# Patient Record
Sex: Female | Born: 1960 | Race: White | Hispanic: No | Marital: Single | Smoking: Current every day smoker
Health system: Southern US, Community
[De-identification: ages and names within clinical notes are randomized; demographics above are authoritative.]

## PROBLEM LIST (undated history)

## (undated) DIAGNOSIS — Z720 Tobacco use: Secondary | ICD-10-CM

## (undated) DIAGNOSIS — R918 Other nonspecific abnormal finding of lung field: Secondary | ICD-10-CM

## (undated) DIAGNOSIS — C801 Malignant (primary) neoplasm, unspecified: Secondary | ICD-10-CM

## (undated) DIAGNOSIS — M549 Dorsalgia, unspecified: Secondary | ICD-10-CM

## (undated) DIAGNOSIS — G43909 Migraine, unspecified, not intractable, without status migrainosus: Secondary | ICD-10-CM

## (undated) DIAGNOSIS — F419 Anxiety disorder, unspecified: Secondary | ICD-10-CM

## (undated) DIAGNOSIS — G971 Other reaction to spinal and lumbar puncture: Secondary | ICD-10-CM

## (undated) DIAGNOSIS — G8929 Other chronic pain: Secondary | ICD-10-CM

## (undated) DIAGNOSIS — Z8709 Personal history of other diseases of the respiratory system: Secondary | ICD-10-CM

## (undated) DIAGNOSIS — J449 Chronic obstructive pulmonary disease, unspecified: Secondary | ICD-10-CM

## (undated) DIAGNOSIS — R87629 Unspecified abnormal cytological findings in specimens from vagina: Secondary | ICD-10-CM

## (undated) HISTORY — PX: TONSILLECTOMY: SUR1361

## (undated) HISTORY — DX: Other nonspecific abnormal finding of lung field: R91.8

## (undated) HISTORY — DX: Unspecified abnormal cytological findings in specimens from vagina: R87.629

## (undated) HISTORY — DX: Personal history of other diseases of the respiratory system: Z87.09

## (undated) HISTORY — PX: CRYOTHERAPY: SHX1416

## (undated) HISTORY — DX: Migraine, unspecified, not intractable, without status migrainosus: G43.909

## (undated) HISTORY — DX: Chronic obstructive pulmonary disease, unspecified: J44.9

## (undated) HISTORY — DX: Tobacco use: Z72.0

## (undated) HISTORY — PX: BREAST SURGERY: SHX581

## (undated) HISTORY — DX: Anxiety disorder, unspecified: F41.9

---

## 1998-12-23 HISTORY — PX: TUBAL LIGATION: SHX77

## 2001-03-29 ENCOUNTER — Emergency Department (HOSPITAL_COMMUNITY): Admission: EM | Admit: 2001-03-29 | Discharge: 2001-03-29 | Payer: Self-pay | Admitting: *Deleted

## 2004-05-18 ENCOUNTER — Encounter
Admission: RE | Admit: 2004-05-18 | Discharge: 2004-08-16 | Payer: Self-pay | Admitting: Physical Medicine and Rehabilitation

## 2008-12-09 ENCOUNTER — Ambulatory Visit: Payer: Self-pay | Admitting: Cardiology

## 2009-06-16 ENCOUNTER — Ambulatory Visit: Payer: Self-pay | Admitting: Cardiology

## 2012-07-11 DIAGNOSIS — R079 Chest pain, unspecified: Secondary | ICD-10-CM

## 2012-09-28 DIAGNOSIS — R079 Chest pain, unspecified: Secondary | ICD-10-CM

## 2012-10-18 ENCOUNTER — Encounter: Payer: Self-pay | Admitting: *Deleted

## 2012-10-19 ENCOUNTER — Encounter: Payer: Self-pay | Admitting: Cardiology

## 2012-10-19 ENCOUNTER — Ambulatory Visit (INDEPENDENT_AMBULATORY_CARE_PROVIDER_SITE_OTHER): Payer: Medicaid Other | Admitting: Cardiology

## 2012-10-19 VITALS — BP 114/79 | HR 103 | Ht <= 58 in | Wt 81.0 lb

## 2012-10-19 DIAGNOSIS — R002 Palpitations: Secondary | ICD-10-CM

## 2012-10-19 DIAGNOSIS — Z136 Encounter for screening for cardiovascular disorders: Secondary | ICD-10-CM

## 2012-10-19 NOTE — Patient Instructions (Signed)
   2 week heart monitor - will be mailed to your home  Office will contact with results Continue all current medications. Follow up after above

## 2012-10-19 NOTE — Progress Notes (Signed)
Patient ID: Amber Salazar, female   DOB: [birth year], 51 y.o.   MRN: [id number] PCP: Dr. Dimas Aguas  51 yo with history of COPD presents for evaluation of abnormal ECG and palpitations.  Patient was noted to have a short PR interval on ECG by her PCP and was referred for evaluation.  She has been under a lot of stress lately (one of her sons is going to be going to prison).  Since 9/13, she has had episodes of tachypalpitations.  She will feel her heart race for a few minutes.  It seems to be stress-related.  No lightheadedness or syncope with these spells.  She does not remember palpitations prior to 9/13.  She did have one episode where she was at work and actually thought she felt her heart slow down a lot.  She became lightheaded but did not pass out.  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  Despite her COPD, she is able to perform her duties with minimal exertional dyspnea though she is tired at the end of the day.  No chest pain.  No claudication.   ECG: NSR with short PR interval (95 msec), nonspecific ST changes.  No delta wave.   PMH: 1. Migraine headaches 2. COPD: Diagnosed by PFTs, active smoker.  Sees Dr. Orson Aloe for pulmonary.   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   FH: No cardiac disease.   ROS: All systems reviewed and negative except as per HPI.   Current Outpatient Prescriptions  Medication Sig Dispense Refill  . acetaminophen (TYLENOL) 500 MG tablet Take 1,000 mg by mouth every 6 (six) hours as needed.      Marland Kitchen albuterol (PROVENTIL HFA;VENTOLIN HFA) 108 (90 BASE) MCG/ACT inhaler Inhale 2 puffs into the lungs every 6 (six) hours as needed.      Marland Kitchen ibuprofen (ADVIL,MOTRIN) 400 MG tablet Take 400 mg by mouth every 6 (six) hours as needed.        BP 114/79  Pulse 103  Ht 4\' 10"  (1.473 m)  Wt 81 lb (36.741 kg)  BMI 16.93 kg/m2 General: NAD Neck: No JVD, no thyromegaly or thyroid nodule.  Lungs: Prolonged expiratory  phase.  CV: Nondisplaced PMI.  Heart regular S1/S2, no S3/S4, no murmur.  No peripheral edema.  No carotid bruit.  Normal pedal pulses.  Abdomen: Soft, nontender, no hepatosplenomegaly, no distention.  Skin: Intact without lesions or rashes.  Neurologic: Alert and oriented x 3.  Psych: Normal affect. Extremities: No clubbing or cyanosis.  HEENT: Normal.   Assessment/Plan: 1. Palpitations/abnormal ECG: Patient has a short PR interval on ECG.  There is no delta wave so WPW is unlikely.  Lown-Ganong-Levine syndrome and enhanced AV nodal conduction are possibilities to explain this ECG finding.  Both of these entities can lead to episodes of SVT.  Her tachypalpitations could be SVT.  I will have her wear a 2 week event monitor to look for SVT.  If this is documented, I would likely start her on a calcium channel blocker (rather than beta blocker given COPD).  2. Smoking/COPD: I strongly encouraged her to try to quit smoking.  Wellbutrin would be a reasonable cessation aide for her.  She should be careful with albuterol, especially if SVT is documented.  Atrovent would be a less arrhythmogenic alternative bronchodilator.   Marca Ancona 10/19/2012 1:36 PM

## 2012-10-24 DIAGNOSIS — R0989 Other specified symptoms and signs involving the circulatory and respiratory systems: Secondary | ICD-10-CM

## 2012-11-06 ENCOUNTER — Telehealth: Payer: Self-pay | Admitting: Cardiology

## 2012-11-06 NOTE — Telephone Encounter (Signed)
Pt called back and I spoke w/ Inocencio Homes and she told me to tell the patient to go ahead and send the monitor back to [facility].

## 2012-11-06 NOTE — Telephone Encounter (Signed)
[facility] called and said the readings from start till 12-28 was good (11 days) and one of the prongs was messed up on the inside causing the machine to not work. She was suppose to return it today and was wondering will she have to wear it longer because it messed up or can she go ahead and return it.

## 2012-11-06 NOTE — Telephone Encounter (Signed)
Noted, patient has follow up already scheduled for 11/22/12 with Dr. Shirlee Latch.

## 2012-11-09 ENCOUNTER — Ambulatory Visit: Payer: Medicaid Other | Admitting: Cardiology

## 2012-11-22 ENCOUNTER — Encounter: Payer: Self-pay | Admitting: Cardiology

## 2012-11-22 ENCOUNTER — Ambulatory Visit (INDEPENDENT_AMBULATORY_CARE_PROVIDER_SITE_OTHER): Payer: Medicaid Other | Admitting: Cardiology

## 2012-11-22 VITALS — BP 112/78 | HR 88 | Ht <= 58 in | Wt 86.1 lb

## 2012-11-22 DIAGNOSIS — R002 Palpitations: Secondary | ICD-10-CM

## 2012-11-22 MED ORDER — AZITHROMYCIN 250 MG PO TABS: ORAL_TABLET | ORAL | Status: DC

## 2012-11-22 MED ORDER — IPRATROPIUM BROMIDE HFA 17 MCG/ACT IN AERS: 1.0000 | INHALATION_SPRAY | Freq: Four times a day (QID) | RESPIRATORY_TRACT | Status: DC | PRN

## 2012-11-22 NOTE — Progress Notes (Signed)
Patient ID: Amber Salazar, female   DOB: 07-25-1961, 52 y.o.   MRN: [id number] PCP: Dr. Dimas Aguas  52 yo with history of COPD presented initially for evaluation of abnormal ECG and palpitations.  Patient was noted to have a short PR interval on ECG by her PCP and was referred for evaluation. Starting in 9/13, she has had episodes of tachypalpitations.  She will feel her heart race for a few minutes.  It seems to be stress-related.  No lightheadedness or syncope with these spells.  She does not remember palpitations prior to 9/13.  She has been under stress.  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  Despite her COPD, she is able to perform her duties with minimal exertional dyspnea though she is tired at the end of the day.  No chest pain.  No claudication.   To work up the palpitations, I had her wear an event monitor for 3 weeks.  This showed only occasional PACs, no significant arrhythmias.  Since then, the palpitations seem to have died down.  She has been avoiding caffeine and has not been using albuterol often.  For the last several days, she has been wheezing and coughing.   ECG: NSR with short PR interval (95 msec), nonspecific ST changes.  No delta wave.   PMH: 1. Migraine headaches 2. COPD: Diagnosed by PFTs, active smoker.  Sees Dr. Orson Aloe for pulmonary.  3. Palpitations: Suspect PACs.  3 week event monitor (12/13) with PACs only.   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   FH: No cardiac disease.    Current Outpatient Prescriptions  Medication Sig Dispense Refill  . acetaminophen (TYLENOL) 500 MG tablet Take 1,000 mg by mouth every 6 (six) hours as needed.      Marland Kitchen ibuprofen (ADVIL,MOTRIN) 400 MG tablet Take 400 mg by mouth every 6 (six) hours as needed.      Marland Kitchen azithromycin (ZITHROMAX Z-PAK) 250 MG tablet Take as directed  6 each  0  . ipratropium (ATROVENT HFA) 17 MCG/ACT inhaler Inhale 1-2 puffs into the lungs 4 (four) times  daily as needed for wheezing.  1 Inhaler  1    BP 112/78  Pulse 88  Ht 4' 9.5" (1.461 m)  Wt 86 lb 1.9 oz (39.064 kg)  BMI 18.31 kg/m2 General: NAD Neck: No JVD, no thyromegaly or thyroid nodule.  Lungs: Prolonged expiratory phase.  CV: Nondisplaced PMI.  Heart regular S1/S2, no S3/S4, no murmur.  No peripheral edema.  No carotid bruit.  Normal pedal pulses.  Abdomen: Soft, nontender, no hepatosplenomegaly, no distention.  Neurologic: Alert and oriented x 3.  Psych: Normal affect. Extremities: No clubbing or cyanosis.   Assessment/Plan: 1. Palpitations/abnormal ECG: Patient has a short PR interval on ECG.  There is no delta wave so WPW is unlikely.  Lown-Ganong-Levine syndrome and enhanced AV nodal conduction are possibilities to explain this ECG finding.  Both of these entities can lead to episodes of SVT.  Her event monitor, however, only showed PACs.  This may be the cause of her symptoms  They also seem to have subsided in terms of symptoms.  I will give her a prescription for Atrovent to use instead of albuterol, which may trigger PACs.  2. Smoking/COPD: I strongly encouraged her to try to quit smoking.  Wellbutrin would be a reasonable cessation aide for her.  I gave her a prescription for Atrovent to use in lieu of albuterol  given PACs.  She has been coughing and wheezing for the last few days.  Given her COPD, I will give her a prescription for a course of azithromycin today.   Marca Ancona 11/22/2012 10:27 PM

## 2012-11-22 NOTE — Patient Instructions (Signed)
   Stop Albuterol  Change to Atrovent 1-2 puffs four x day as needed   Z-pack  Continue all other current medications. Follow up as needed

## 2013-07-04 ENCOUNTER — Ambulatory Visit (INDEPENDENT_AMBULATORY_CARE_PROVIDER_SITE_OTHER): Payer: Medicaid Other

## 2013-07-04 ENCOUNTER — Ambulatory Visit (INDEPENDENT_AMBULATORY_CARE_PROVIDER_SITE_OTHER): Payer: Medicaid Other | Admitting: Neurology

## 2013-07-04 DIAGNOSIS — Z0289 Encounter for other administrative examinations: Secondary | ICD-10-CM

## 2013-07-04 DIAGNOSIS — M79609 Pain in unspecified limb: Secondary | ICD-10-CM

## 2013-07-04 DIAGNOSIS — M47812 Spondylosis without myelopathy or radiculopathy, cervical region: Secondary | ICD-10-CM

## 2013-07-04 DIAGNOSIS — M47817 Spondylosis without myelopathy or radiculopathy, lumbosacral region: Secondary | ICD-10-CM
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
## 2013-07-30 DIAGNOSIS — Z0289 Encounter for other administrative examinations: Secondary | ICD-10-CM

## 2014-07-03 ENCOUNTER — Other Ambulatory Visit: Payer: Self-pay | Admitting: Unknown Physician Specialty

## 2014-07-03 DIAGNOSIS — R921 Mammographic calcification found on diagnostic imaging of breast: Secondary | ICD-10-CM

## 2014-07-10 ENCOUNTER — Ambulatory Visit
Admission: RE | Admit: 2014-07-10 | Discharge: 2014-07-10 | Disposition: A | Discharge status: Home / Self Care | Payer: Medicaid Other | Source: Ambulatory Visit | Attending: Unknown Physician Specialty | Admitting: Unknown Physician Specialty

## 2014-07-10 DIAGNOSIS — R921 Mammographic calcification found on diagnostic imaging of breast: Secondary | ICD-10-CM

## 2016-02-03 DIAGNOSIS — J449 Chronic obstructive pulmonary disease, unspecified: Secondary | ICD-10-CM | POA: Diagnosis not present

## 2016-02-03 DIAGNOSIS — D649 Anemia, unspecified: Secondary | ICD-10-CM | POA: Diagnosis not present

## 2016-02-03 DIAGNOSIS — F329 Major depressive disorder, single episode, unspecified: Secondary | ICD-10-CM | POA: Diagnosis not present

## 2016-02-03 DIAGNOSIS — M545 Low back pain: Secondary | ICD-10-CM | POA: Diagnosis not present

## 2016-02-03 DIAGNOSIS — Z1389 Encounter for screening for other disorder: Secondary | ICD-10-CM | POA: Diagnosis not present

## 2016-02-03 DIAGNOSIS — M542 Cervicalgia: Secondary | ICD-10-CM | POA: Diagnosis not present

## 2016-02-03 DIAGNOSIS — Z720 Tobacco use: Secondary | ICD-10-CM | POA: Diagnosis not present

## 2016-05-03 DIAGNOSIS — Z720 Tobacco use: Secondary | ICD-10-CM | POA: Diagnosis not present

## 2016-05-03 DIAGNOSIS — J019 Acute sinusitis, unspecified: Secondary | ICD-10-CM | POA: Diagnosis not present

## 2016-05-03 DIAGNOSIS — R5383 Other fatigue: Secondary | ICD-10-CM | POA: Diagnosis not present

## 2016-05-03 DIAGNOSIS — J449 Chronic obstructive pulmonary disease, unspecified: Secondary | ICD-10-CM | POA: Diagnosis not present

## 2016-05-03 DIAGNOSIS — M545 Low back pain: Secondary | ICD-10-CM | POA: Diagnosis not present

## 2016-05-03 DIAGNOSIS — M542 Cervicalgia: Secondary | ICD-10-CM | POA: Diagnosis not present

## 2016-05-03 DIAGNOSIS — F329 Major depressive disorder, single episode, unspecified: Secondary | ICD-10-CM | POA: Diagnosis not present

## 2016-05-03 DIAGNOSIS — D649 Anemia, unspecified: Secondary | ICD-10-CM | POA: Diagnosis not present

## 2016-06-04 DIAGNOSIS — M542 Cervicalgia: Secondary | ICD-10-CM | POA: Diagnosis not present

## 2016-06-04 DIAGNOSIS — R300 Dysuria: Secondary | ICD-10-CM | POA: Diagnosis not present

## 2016-06-04 DIAGNOSIS — M545 Low back pain: Secondary | ICD-10-CM | POA: Diagnosis not present

## 2016-06-04 DIAGNOSIS — F329 Major depressive disorder, single episode, unspecified: Secondary | ICD-10-CM | POA: Diagnosis not present

## 2016-06-04 DIAGNOSIS — Z720 Tobacco use: Secondary | ICD-10-CM | POA: Diagnosis not present

## 2016-06-04 DIAGNOSIS — Z681 Body mass index (BMI) 19 or less, adult: Secondary | ICD-10-CM | POA: Diagnosis not present

## 2016-06-04 DIAGNOSIS — G43919 Migraine, unspecified, intractable, without status migrainosus: Secondary | ICD-10-CM | POA: Diagnosis not present

## 2016-06-04 DIAGNOSIS — J449 Chronic obstructive pulmonary disease, unspecified: Secondary | ICD-10-CM | POA: Diagnosis not present

## 2016-07-08 DIAGNOSIS — Z1231 Encounter for screening mammogram for malignant neoplasm of breast: Secondary | ICD-10-CM | POA: Diagnosis not present

## 2016-07-15 DIAGNOSIS — J019 Acute sinusitis, unspecified: Secondary | ICD-10-CM | POA: Diagnosis not present

## 2016-07-15 DIAGNOSIS — J209 Acute bronchitis, unspecified: Secondary | ICD-10-CM | POA: Diagnosis not present

## 2016-07-15 DIAGNOSIS — Z681 Body mass index (BMI) 19 or less, adult: Secondary | ICD-10-CM | POA: Diagnosis not present

## 2016-07-23 DIAGNOSIS — J432 Centrilobular emphysema: Secondary | ICD-10-CM | POA: Diagnosis not present

## 2016-07-23 DIAGNOSIS — Z720 Tobacco use: Secondary | ICD-10-CM | POA: Diagnosis not present

## 2016-07-23 DIAGNOSIS — F418 Other specified anxiety disorders: Secondary | ICD-10-CM | POA: Diagnosis not present

## 2016-09-21 DIAGNOSIS — J449 Chronic obstructive pulmonary disease, unspecified: Secondary | ICD-10-CM | POA: Diagnosis not present

## 2016-10-04 DIAGNOSIS — Z681 Body mass index (BMI) 19 or less, adult: Secondary | ICD-10-CM | POA: Diagnosis not present

## 2016-10-04 DIAGNOSIS — M542 Cervicalgia: Secondary | ICD-10-CM | POA: Diagnosis not present

## 2016-10-04 DIAGNOSIS — G43919 Migraine, unspecified, intractable, without status migrainosus: Secondary | ICD-10-CM | POA: Diagnosis not present

## 2016-10-04 DIAGNOSIS — F329 Major depressive disorder, single episode, unspecified: Secondary | ICD-10-CM | POA: Diagnosis not present

## 2016-10-04 DIAGNOSIS — M545 Low back pain: Secondary | ICD-10-CM | POA: Diagnosis not present

## 2016-10-04 DIAGNOSIS — Z720 Tobacco use: Secondary | ICD-10-CM | POA: Diagnosis not present

## 2016-10-04 DIAGNOSIS — E559 Vitamin D deficiency, unspecified: Secondary | ICD-10-CM | POA: Diagnosis not present

## 2016-10-04 DIAGNOSIS — J449 Chronic obstructive pulmonary disease, unspecified: Secondary | ICD-10-CM | POA: Diagnosis not present

## 2016-11-16 DIAGNOSIS — J449 Chronic obstructive pulmonary disease, unspecified: Secondary | ICD-10-CM | POA: Diagnosis not present

## 2016-12-03 DIAGNOSIS — Z681 Body mass index (BMI) 19 or less, adult: Secondary | ICD-10-CM | POA: Diagnosis not present

## 2016-12-03 DIAGNOSIS — J449 Chronic obstructive pulmonary disease, unspecified: Secondary | ICD-10-CM | POA: Diagnosis not present

## 2016-12-03 DIAGNOSIS — R300 Dysuria: Secondary | ICD-10-CM | POA: Diagnosis not present

## 2016-12-03 DIAGNOSIS — J019 Acute sinusitis, unspecified: Secondary | ICD-10-CM | POA: Diagnosis not present

## 2016-12-21 DIAGNOSIS — J111 Influenza due to unidentified influenza virus with other respiratory manifestations: Secondary | ICD-10-CM | POA: Diagnosis not present

## 2016-12-21 DIAGNOSIS — R509 Fever, unspecified: Secondary | ICD-10-CM | POA: Diagnosis not present

## 2016-12-21 DIAGNOSIS — J449 Chronic obstructive pulmonary disease, unspecified: Secondary | ICD-10-CM | POA: Diagnosis not present

## 2017-01-26 DIAGNOSIS — L821 Other seborrheic keratosis: Secondary | ICD-10-CM | POA: Diagnosis not present

## 2017-01-26 DIAGNOSIS — J029 Acute pharyngitis, unspecified: Secondary | ICD-10-CM | POA: Diagnosis not present

## 2017-01-26 DIAGNOSIS — J449 Chronic obstructive pulmonary disease, unspecified: Secondary | ICD-10-CM | POA: Diagnosis not present

## 2017-02-08 DIAGNOSIS — J410 Simple chronic bronchitis: Secondary | ICD-10-CM | POA: Diagnosis not present

## 2017-02-11 DIAGNOSIS — J449 Chronic obstructive pulmonary disease, unspecified: Secondary | ICD-10-CM | POA: Diagnosis not present

## 2017-02-24 DIAGNOSIS — Z01419 Encounter for gynecological examination (general) (routine) without abnormal findings: Secondary | ICD-10-CM | POA: Diagnosis not present

## 2017-03-31 ENCOUNTER — Institutional Professional Consult (permissible substitution): Payer: Medicaid Other | Admitting: Pulmonary Disease

## 2017-04-07 DIAGNOSIS — J449 Chronic obstructive pulmonary disease, unspecified: Secondary | ICD-10-CM | POA: Diagnosis not present

## 2017-04-19 ENCOUNTER — Encounter: Payer: Self-pay | Admitting: Pulmonary Disease

## 2017-05-03 ENCOUNTER — Institutional Professional Consult (permissible substitution): Payer: Medicaid Other | Admitting: Pulmonary Disease

## 2017-05-18 DIAGNOSIS — F329 Major depressive disorder, single episode, unspecified: Secondary | ICD-10-CM | POA: Diagnosis not present

## 2017-05-18 DIAGNOSIS — J449 Chronic obstructive pulmonary disease, unspecified: Secondary | ICD-10-CM | POA: Diagnosis not present

## 2017-05-18 DIAGNOSIS — Z681 Body mass index (BMI) 19 or less, adult: Secondary | ICD-10-CM | POA: Diagnosis not present

## 2017-05-18 DIAGNOSIS — R300 Dysuria: Secondary | ICD-10-CM | POA: Diagnosis not present

## 2017-05-26 DIAGNOSIS — J449 Chronic obstructive pulmonary disease, unspecified: Secondary | ICD-10-CM | POA: Diagnosis not present

## 2017-05-26 DIAGNOSIS — F329 Major depressive disorder, single episode, unspecified: Secondary | ICD-10-CM | POA: Diagnosis not present

## 2017-05-26 DIAGNOSIS — Z681 Body mass index (BMI) 19 or less, adult: Secondary | ICD-10-CM | POA: Diagnosis not present

## 2017-06-02 ENCOUNTER — Institutional Professional Consult (permissible substitution): Payer: Medicaid Other | Admitting: Pulmonary Disease

## 2017-07-04 ENCOUNTER — Encounter: Payer: Self-pay | Admitting: Internal Medicine

## 2017-07-04 ENCOUNTER — Ambulatory Visit (INDEPENDENT_AMBULATORY_CARE_PROVIDER_SITE_OTHER): Payer: Medicare Other | Admitting: Internal Medicine

## 2017-07-04 ENCOUNTER — Other Ambulatory Visit: Payer: Medicaid Other

## 2017-07-04 ENCOUNTER — Ambulatory Visit (INDEPENDENT_AMBULATORY_CARE_PROVIDER_SITE_OTHER)
Admission: RE | Admit: 2017-07-04 | Discharge: 2017-07-04 | Disposition: A | Discharge status: Home / Self Care | Payer: Medicare Other | Source: Ambulatory Visit | Attending: Internal Medicine | Admitting: Internal Medicine

## 2017-07-04 ENCOUNTER — Telehealth: Payer: Self-pay | Admitting: Internal Medicine

## 2017-07-04 VITALS — BP 118/82 | HR 101 | Ht 59.0 in | Wt 84.8 lb

## 2017-07-04 DIAGNOSIS — J449 Chronic obstructive pulmonary disease, unspecified: Secondary | ICD-10-CM | POA: Diagnosis not present

## 2017-07-04 DIAGNOSIS — R0609 Other forms of dyspnea: Secondary | ICD-10-CM

## 2017-07-04 DIAGNOSIS — R0602 Shortness of breath: Secondary | ICD-10-CM | POA: Diagnosis not present

## 2017-07-04 MED ORDER — AZITHROMYCIN 250 MG PO TABS: ORAL_TABLET | ORAL | 0 refills | Status: DC

## 2017-07-04 MED ORDER — TIOTROPIUM BROMIDE-OLODATEROL 2.5-2.5 MCG/ACT IN AERS: 2.0000 | INHALATION_SPRAY | Freq: Every day | RESPIRATORY_TRACT | 11 refills | Status: DC

## 2017-07-04 MED ORDER — TIOTROPIUM BROMIDE-OLODATEROL 2.5-2.5 MCG/ACT IN AERS: 2.0000 | INHALATION_SPRAY | Freq: Every day | RESPIRATORY_TRACT | 0 refills | Status: DC

## 2017-07-04 NOTE — Progress Notes (Signed)
Spoke with pt and notified of results per Dr. Wert. Pt verbalized understanding and denied any questions. 

## 2017-07-04 NOTE — Telephone Encounter (Signed)
Spoke with the pt  She states that she was wondering if MW still wanted her to use her albuterol inhaler  I went over the AVS with her again, and she now understands she can use albuterol as her plan B as needed  Nothing further needed

## 2017-07-04 NOTE — Progress Notes (Signed)
Subjective:     Patient ID: Amber Salazar, female   DOB: 12-30-1960,   MRN: [id number]  HPI  56 yowf quit smoking 03/2016 but actually developed doe  and cough in the 1990's dx as copd by obgyn in 1999 then confirmed in 2008 and tried multiple different inhalers but likes spiriva the best referred to pulmonary clinic 07/04/2017 by [place] using hfa up 6 x daily    07/04/2017 1st [facility] office visit/ Amber Salazar  On spiriva maint/ freq saba and sama Chief Complaint  Patient presents with  . Advice Only    Referred due to COPD. Pt states that she does become SOB all the time, states that she does have a prod. cough with white to yellow mucus, and also has occ. CP. Pt states that she was originally diagnosed with COPD in 1999.  new "constant" R lower lateral  cp x a year no change with cough best left down, right down  Doe x 50 ft flat but also limited by back and neck pain and dizziness = MMRC3 = can't walk 100 yards even at a slow pace at a flat grade s stopping due to sob    No obvious day to day or daytime variability or assoc excess/ purulent sputum or mucus plugs or hemoptysis or cp or chest tightness, subjective wheeze or overt sinus or hb symptoms. No unusual exp hx or h/o childhood pna/ asthma or knowledge of premature birth.  Sleeping ok without nocturnal  or early am exacerbation  of respiratory  c/o's or need for noct saba. Also denies any obvious fluctuation of symptoms with weather or environmental changes or other aggravating or alleviating factors except as outlined above   Current Medications, Allergies, Complete Past Medical History, Past Surgical History, Family History, and Social History were reviewed in [employer] record.  ROS  The following are not active complaints unless bolded sore throat, dysphagia, dental problems, itching, sneezing,  nasal congestion or excess/ purulent secretions, ear ache,   fever, chills, sweats,  unintended wt loss, classically pleuritic or exertional cp,  orthopnea pnd or leg swelling, presyncope, palpitations, abdominal pain, anorexia, nausea, vomiting, diarrhea  or change in bowel or bladder habits, change in stools or urine, dysuria,hematuria,  rash, arthralgias, visual complaints, headache, numbness, weakness or ataxia or problems with walking or coordination,  change in mood/affect or memory.         Review of Systems     Objective:   Physical Exam    Chronically ill amb wf nad at rest/ very somber  Wt Readings from Last 3 Encounters:  07/04/17 84 lb 12.8 oz (38.5 kg)  11/22/12 86 lb 1.9 oz (39.1 kg)  10/19/12 81 lb (36.7 kg)    Vital signs reviewed  - Note on arrival 02 sats  98% on RA     HEENT: nl dentition, turbinates bilaterally, and oropharynx. Nl external ear canals without cough reflex   NECK :  without JVD/Nodes/TM/ nl carotid upstrokes bilaterally   LUNGS: no acc muscle use,  Nl contour chest which is clear to A and P bilaterally with distant bs bilaterally   CV:  RRR  no s3 or murmur or increase in P2, and no edema   ABD:  soft and nontender with nl inspiratory excursion in the supine position. No bruits or organomegaly appreciated, bowel sounds nl  MS:  Nl gait/ ext warm without deformities, calf tenderness, cyanosis or clubbing No obvious joint restrictions  SKIN: warm and dry without lesions    NEURO:  alert, approp, nl sensorium with  no motor or cerebellar deficits apparent.      CXR PA and Lateral:   07/04/2017 :    I personally reviewed images and agree with radiology impression as follows:    Chronic bronchitic changes, stable. There is no acute cardiopulmonary abnormality    Assessment:

## 2017-07-04 NOTE — Patient Instructions (Addendum)
zpak  Plan A = Automatic = stiolto 2 pffs each am   Work on inhaler technique:  relax and gently blow all the way out then take a nice smooth deep breath back in, triggering the inhaler at same time you start breathing in.  Hold for up to 5 seconds if you can.  Rinse and gargle with water when done      Plan B = Backup Only use your albuterol as a rescue medication to be used if you can't catch your breath by resting or doing a relaxed purse lip breathing pattern.  - The less you use it, the better it will work when you need it. - Ok to use the inhaler up to 2 puffs  every 4 hours if you must but call for appointment if use goes up over your usual need - Don't leave home without it !!  (think of it like the spare tire for your car)    Please remember to go to the lab and x-ray department downstairs in the basement  for your tests - we will call you with the results when they are available.   Please schedule a follow up office visit in 6 weeks, call sooner if needed with all medications /inhalers/ solutions in hand so we can verify exactly what you are taking. This includes all medications from all doctors and over the counters

## 2017-07-05 NOTE — Assessment & Plan Note (Addendum)
Spirometry 07/04/2017  Very poor effort/ no reliable data -  07/04/2017   Walked RA x 50 stopped due to   Dizzy and sob with sats 100%  - 07/04/2017  After extensive coaching HFA effectiveness =    75% > try stiolto 2 pffs each am  - alpha one screening 07/04/2017   DDX of  difficult airways management almost all start with A and  include Adherence, Ace Inhibitors, Acid Reflux, Active Sinus Disease, Alpha 1 Antitripsin deficiency, Anxiety masquerading as Airways dz,  ABPA,  Allergy(esp in young), Aspiration (esp in elderly), Adverse effects of meds,  Active smokers, A bunch of PE's (a small clot burden can't cause this syndrome unless there is already severe underlying pulm or vascular dz with poor reserve) plus two Bs  = Bronchiectasis and Beta blocker use..and one C= CHF  Adherence is always the initial "prime suspect" and is a multilayered concern that requires a "trust but verify" approach in every patient - starting with knowing how to use medications, especially inhalers, correctly, keeping up with refills and understanding the fundamental difference between maintenance and prns vs those medications only taken for a very short course and then stopped and not refilled.  - see hfa teaching - needs to return with all meds in hand using a trust but verify approach to confirm accurate Medication  Reconciliation The principal here is that until we are certain that the  patients are doing what we've asked, it makes no sense to ask them to do more.    ? Alpha one def > screen sent   ? Anxiety > usually at the bottom of this list of usual suspects but should be much higher on this pt's based on H and P  And walking study today.  ? Active sinusitis/ bronchitis > zpak > ? Need for sinus ct at some point.  ? Chf> nothing to support on cxr or exam    Clearly Pt is Group B in terms of symptom/risk and laba/lama therefore appropriate rx at this point.    will return with all meds for full pfts next    Total time devoted to counseling  > 50 % of initial 60 min office visit:  review case with pt/ discussion of options/alternatives/ personally creating written customized instructions  in presence of pt  then going over those specific  Instructions directly with the pt including how to use all of the meds but in particular covering each new medication in detail and the difference between the maintenance= "automatic" meds and the prns using an action plan format for the latter (If this problem/symptom => do that organization reading Left to right).  Please see AVS from this visit for a full list of these instructions which I personally wrote for this pt and  are unique to this visit.

## 2017-07-06 LAB — ALPHA-1-ANTITRYPSIN: A-1 Antitrypsin, Ser: 169 mg/dL (ref 83–199)

## 2017-07-07 LAB — ALPHA-1 ANTITRYPSIN PHENOTYPE: A-1 Antitrypsin: 172 mg/dL (ref 83–199)

## 2017-07-07 NOTE — Progress Notes (Signed)
Spoke with pt and notified of results per Dr. Wert. Pt verbalized understanding and denied any questions. 

## 2017-07-28 DIAGNOSIS — Z1231 Encounter for screening mammogram for malignant neoplasm of breast: Secondary | ICD-10-CM | POA: Diagnosis not present

## 2017-08-09 DIAGNOSIS — N3001 Acute cystitis with hematuria: Secondary | ICD-10-CM | POA: Diagnosis not present

## 2017-08-09 DIAGNOSIS — Z681 Body mass index (BMI) 19 or less, adult: Secondary | ICD-10-CM | POA: Diagnosis not present

## 2017-08-15 ENCOUNTER — Other Ambulatory Visit: Payer: Self-pay | Admitting: Internal Medicine

## 2017-08-15 ENCOUNTER — Encounter: Payer: Self-pay | Admitting: Internal Medicine

## 2017-08-15 ENCOUNTER — Ambulatory Visit (INDEPENDENT_AMBULATORY_CARE_PROVIDER_SITE_OTHER): Payer: Medicare Other | Admitting: Internal Medicine

## 2017-08-15 VITALS — BP 114/78 | HR 99 | Ht 59.0 in | Wt 82.0 lb

## 2017-08-15 DIAGNOSIS — J449 Chronic obstructive pulmonary disease, unspecified: Secondary | ICD-10-CM

## 2017-08-15 MED ORDER — PANTOPRAZOLE SODIUM 40 MG PO TBEC: 40.0000 mg | DELAYED_RELEASE_TABLET | Freq: Every day | ORAL | 2 refills | Status: AC

## 2017-08-15 MED ORDER — FAMOTIDINE 20 MG PO TABS: ORAL_TABLET | ORAL | 2 refills | Status: DC

## 2017-08-15 MED ORDER — PANTOPRAZOLE SODIUM 40 MG PO TBEC: 40.0000 mg | DELAYED_RELEASE_TABLET | Freq: Every day | ORAL | 2 refills | Status: DC

## 2017-08-15 MED ORDER — GLYCOPYRROLATE-FORMOTEROL 9-4.8 MCG/ACT IN AERO: 2.0000 | INHALATION_SPRAY | Freq: Two times a day (BID) | RESPIRATORY_TRACT | 11 refills | Status: DC

## 2017-08-15 MED ORDER — GLYCOPYRROLATE-FORMOTEROL 9-4.8 MCG/ACT IN AERO: 2.0000 | INHALATION_SPRAY | Freq: Two times a day (BID) | RESPIRATORY_TRACT | 0 refills | Status: DC

## 2017-08-15 MED ORDER — FAMOTIDINE 20 MG PO TABS: ORAL_TABLET | ORAL | 2 refills | Status: AC

## 2017-08-15 NOTE — Assessment & Plan Note (Addendum)
Spirometry 07/04/2017  Very poor effort/ no reliable data -  07/04/2017   Walked RA x 50 stopped due to   Dizzy and sob with sats 100%  - 07/04/2017    try stiolto 2 pffs each am > could not tol  - alpha one screening 07/04/2017  MM  - 08/15/2017  After extensive coaching HFA effectiveness =    75% from a baseline of 25% > try bevespi 2bid  DDX of  difficult airways management almost all start with A and  include Adherence, Ace Inhibitors, Acid Reflux, Active Sinus Disease, Alpha 1 Antitripsin deficiency, Anxiety masquerading as Airways dz,  ABPA,  Allergy(esp in young), Aspiration (esp in elderly), Adverse effects of meds,  Active smokers, A bunch of PE's (a small clot burden can't cause this syndrome unless there is already severe underlying pulm or vascular dz with poor reserve) plus two Bs  = Bronchiectasis and Beta blocker use..and one C= CHF   Adherence is always the initial "prime suspect" and is a multilayered concern that requires a "trust but verify" approach in every patient - starting with knowing how to use medications, especially inhalers, correctly, keeping up with refills and understanding the fundamental difference between maintenance and prns vs those medications only taken for a very short course and then stopped and not refilled.  - see hfa teaching   ? Acid (or non-acid) GERD > always difficult to exclude as up to 75% of pts in some series report no assoc GI/ Heartburn symptoms> rec max (24h)  acid suppression and diet restrictions/ reviewed and instructions given in writing.   ? Active smoking > denies though still exposed to passive smoking   ? Anxiety/depression  > usually at the bottom of this list of usual suspects but should be much higher on this pt's based on H and P    ? Adverse drug effects > on macrodantin but finishing one week course 08/16/17.    Will try bevespi x one week sample to see if tolerates if not resume just spiriva smi and f/u either way in 4 weeks with  full pfts if can perform  I had an extended discussion with the patient reviewing all relevant studies completed to date and  lasting 15 to 20 minutes of a 25 minute visit    Each maintenance medication was reviewed in detail including most importantly the difference between maintenance and prns and under what circumstances the prns are to be triggered using an action plan format that is not reflected in the computer generated alphabetically organized AVS.    Please see AVS for specific instructions unique to this visit that I personally wrote and verbalized to the the pt in detail and then reviewed with pt  by my nurse highlighting any  changes in therapy recommended at today's visit to their plan of care.

## 2017-08-15 NOTE — Patient Instructions (Addendum)
Pantoprazole (protonix) 40 mg   Take  30-60 min before first meal of the day and Pepcid (famotidine)  20 mg one @  bedtime until return to office - this is the best way to tell whether stomach acid is contributing to your problem.     GERD (REFLUX)  is an extremely common cause of respiratory symptoms just like yours , many times with no obvious heartburn at all.    It can be treated with medication, but also with lifestyle changes including elevation of the head of your bed (ideally with 6 inch  bed blocks),  Smoking cessation, avoidance of late meals, excessive alcohol, and avoid fatty foods, chocolate, peppermint, colas, red wine, and acidic juices such as orange juice.  NO MINT OR MENTHOL PRODUCTS SO NO COUGH DROPS   USE SUGARLESS CANDY INSTEAD (Jolley ranchers or Stover's or Life Savers) or even ice chips will also do - the key is to swallow to prevent all throat clearing. NO OIL BASED VITAMINS - use powdered substitutes.    Stop spiriva and Try Bevespi Take 2 puffs first thing in am and then another 2 puffs about 12 hours later.   Work on inhaler technique:  relax and gently blow all the way out then take a nice smooth deep breath back in, triggering the inhaler at same time you start breathing in.  Hold for up to 5 seconds if you can. Blow out thru nose. Rinse and gargle with water when done   Only use your Levoalbuterol as a rescue medication to be used if you can't catch your breath by resting or doing a relaxed purse lip breathing pattern.  - The less you use it, the better it will work when you need it. - Ok to use up to 2 puffs  every 4 hours if you must but call for immediate appointment if use goes up over your usual need - Don't leave home without it !!  (think of it like the spare tire for your car)    Please schedule a follow up office visit in 4 weeks, sooner if needed with pfts on return

## 2017-08-15 NOTE — Progress Notes (Signed)
Subjective:     Patient ID: Amber Salazar, female   DOB: 1961/04/04,   MRN: [id number]    Brief patient profile:  63 yowf quit smoking 03/2016 but actually developed doe  and cough in the 1990's dx as copd by obgyn in 1999 then confirmed in 2008 and tried multiple different inhalers but likes spiriva the best referred to pulmonary clinic 07/04/2017 by Jeff using hfa up 6 x daily     History of Present Illness  07/04/2017 1st [facility] office visit/ Wert  On spiriva maint/ freq saba and sama Chief Complaint  Patient presents with  . Advice Only    Referred due to COPD. Pt states that she does become SOB all the time, states that she does have a prod. cough with white to yellow mucus, and also has occ. CP. Pt states that she was originally diagnosed with COPD in 1999.  new "constant" R lower lateral  cp x a year no change with cough best left down, right down  Doe x 50 ft flat but also limited by back and neck pain and dizziness = MMRC3 = can't walk 100 yards even at a slow pace at a flat grade s stopping due to sob   rec zpak Plan A = Automatic = stiolto 2 pffs each am  Work on inhaler technique:   Plan B = Backup Only use your albuterol as a rescue medication     08/15/2017  f/u ov/Wert re: COPD ? Severity  Chief Complaint  Patient presents with  . Follow-up    Breathing is unchanged. She has been coughing more with brown sputum x 1 wk. She is using her xopenex inhaler 4 x daily on average.   last used saba 3 h prior to OV  And feels needs it again now at rest in office  macrobid started one week prior to OV  For uti  Walking limited by dizzy x 50 ft  Chest tightness better with xopenex /  Prev described pain not an issue when she avoid positions that bring it on    No obvious day to day or daytime variability or assoc  mucus plugs or hemoptysis or cp  subjective wheeze or overt sinus or hb symptoms. No unusual exp hx or h/o childhood pna/ asthma or  knowledge of premature birth.  Sleeping ok flat without nocturnal  or early am exacerbation  of respiratory  c/o's or need for noct saba. Also denies any obvious fluctuation of symptoms with weather or environmental changes or other aggravating or alleviating factors except as outlined above   Current Allergies, Complete Past Medical History, Past Surgical History, Family History, and Social History were reviewed in [employer] record.  ROS  The following are not active complaints unless bolded Hoarseness, sore throat, dysphagia, dental problems, itching, sneezing,  nasal congestion or discharge of excess mucus or purulent secretions, ear ache,   fever, chills, sweats, unintended wt loss or wt gain, classically pleuritic or exertional cp,  orthopnea pnd or leg swelling, presyncope, palpitations, abdominal pain, anorexia, nausea, vomiting, diarrhea  or change in bowel habits or change in bladder habits, change in stools or change in urine, dysuria, hematuria,  rash, arthralgias, visual complaints, headache, numbness, weakness or ataxia or problems with walking or coordination,  change in mood/affect or memory.        Current Meds  Medication Sig  . ibuprofen (ADVIL,MOTRIN) 600 MG tablet Take 600 mg by mouth 2 (two)  times daily with a meal.  . levalbuterol (XOPENEX HFA) 45 MCG/ACT inhaler Inhale 2 puffs into the lungs every 6 (six) hours as needed.   . nitrofurantoin, macrocrystal-monohydrate, (MACROBID) 100 MG capsule Take 1 capsule by mouth 2 (two) times daily.  . Tiotropium Bromide-  2.5  Inhale 2 puffs into the lungs daily.                  Objective:   Physical Exam    Chronically ill amb wf nad    08/15/2017       82   07/04/17 84 lb 12.8 oz (38.5 kg)  11/22/12 86 lb 1.9 oz (39.1 kg)  10/19/12 81 lb (36.7 kg)    Vital signs reviewed  - Note on arrival 02 sats  99% on RA     HEENT: nl dentition, turbinates bilaterally, and oropharynx. Nl external ear  canals without cough reflex   NECK :  without JVD/Nodes/TM/ nl carotid upstrokes bilaterally   LUNGS: no acc muscle use,  Nl contour chest which is clear to A and P bilaterally with distant bs bilaterally/ cough on inspiration   CV:  RRR  no s3 or murmur or increase in P2, and no edema   ABD:  soft and nontender with nl inspiratory excursion in the supine position. No bruits or organomegaly appreciated, bowel sounds nl  MS:  Nl gait/ ext warm without deformities, calf tenderness, cyanosis or clubbing No obvious joint restrictions   SKIN: warm and dry without lesions    NEURO:  alert, approp, nl sensorium with  no motor or cerebellar deficits apparent.      CXR PA and Lateral:   07/04/2017 :    I personally reviewed images and agree with radiology impression as follows:    Chronic bronchitic changes, stable. There is no acute cardiopulmonary abnormality    Assessment:

## 2017-08-16 ENCOUNTER — Telehealth: Payer: Self-pay | Admitting: Internal Medicine

## 2017-08-16 NOTE — Telephone Encounter (Signed)
Received a fax from the pt's pharmacy. Amber Salazar is not covered by [employer]. PA has been started on Cover My Meds. Key: [id number]. Will route message to [facility] for follow up.

## 2017-08-17 NOTE — Telephone Encounter (Signed)
Checked Cover My Meds. PA has been approved through 11/07/17. Nothing further was needed.

## 2017-08-25 DIAGNOSIS — B373 Candidiasis of vulva and vagina: Secondary | ICD-10-CM | POA: Diagnosis not present

## 2017-08-25 DIAGNOSIS — M545 Low back pain: Secondary | ICD-10-CM | POA: Diagnosis not present

## 2017-08-25 DIAGNOSIS — F329 Major depressive disorder, single episode, unspecified: Secondary | ICD-10-CM | POA: Diagnosis not present

## 2017-08-25 DIAGNOSIS — R300 Dysuria: Secondary | ICD-10-CM | POA: Diagnosis not present

## 2017-09-14 DIAGNOSIS — R300 Dysuria: Secondary | ICD-10-CM | POA: Diagnosis not present

## 2017-09-14 DIAGNOSIS — Z681 Body mass index (BMI) 19 or less, adult: Secondary | ICD-10-CM | POA: Diagnosis not present

## 2017-09-14 DIAGNOSIS — N3001 Acute cystitis with hematuria: Secondary | ICD-10-CM | POA: Diagnosis not present

## 2017-09-14 DIAGNOSIS — J019 Acute sinusitis, unspecified: Secondary | ICD-10-CM | POA: Diagnosis not present

## 2017-10-11 ENCOUNTER — Encounter: Payer: Self-pay | Admitting: Internal Medicine

## 2017-10-11 ENCOUNTER — Ambulatory Visit (INDEPENDENT_AMBULATORY_CARE_PROVIDER_SITE_OTHER): Payer: Medicare Other | Admitting: Internal Medicine

## 2017-10-11 VITALS — BP 114/80 | HR 89 | Ht 58.5 in | Wt 82.0 lb

## 2017-10-11 DIAGNOSIS — R0609 Other forms of dyspnea: Secondary | ICD-10-CM | POA: Diagnosis not present

## 2017-10-11 DIAGNOSIS — J449 Chronic obstructive pulmonary disease, unspecified: Secondary | ICD-10-CM | POA: Diagnosis not present

## 2017-10-11 LAB — PULMONARY FUNCTION TEST
DL/VA % PRED: 92 %
DL/VA: 3.69 ml/min/mmHg/L
DLCO cor % pred: 76 %
DLCO cor: 12.85 ml/min/mmHg
DLCO unc % pred: 78 %
DLCO unc: 13.15 ml/min/mmHg
FEF 25-75 Pre: 2.97 L/sec
FEF2575-%Pred-Pre: 131 %
FEV1-%PRED-PRE: 106 %
FEV1-Pre: 2.31 L
FEV1FVC-%Pred-Pre: 114 %
FEV6-%PRED-PRE: 95 %
FEV6-Pre: 2.57 L
FEV6FVC-%Pred-Pre: 103 %
FVC-%Pred-Pre: 92 %
FVC-Pre: 2.57 L
Pre FEV1/FVC ratio: 90 %
Pre FEV6/FVC Ratio: 100 %
RV % pred: 128 %
RV: 2.07 L
TLC % PRED: 106 %
TLC: 4.5 L

## 2017-10-11 NOTE — Progress Notes (Signed)
Subjective:    Patient ID: Amber Salazar, female   DOB: 1961-01-31    MRN: [id number]     Brief patient profile: 48 yowf quit smoking 03/2016 but actually developed doe  and cough in the 1990's dx as copd by ob-gyn in 1999 then confirmed in 2008 and tried multiple different inhalers but likes spiriva the best referred to pulmonary clinic 07/04/2017 by Cary using hfa up 6 x daily.     History of Present Illness  07/04/2017 1st [facility] office visit/ Wert  On spiriva maint/ freq saba and sama Chief Complaint  Patient presents with  . Advice Only    Referred due to COPD. Pt states that she does become SOB all the time, states that she does have a prod. cough with white to yellow mucus, and also has occ. CP. Pt states that she was originally diagnosed with COPD in 1999.  new "constant" R lower lateral  cp x a year no change with cough best left down, right down  Doe x 50 ft flat but also limited by back and neck pain and dizziness = MMRC3 = can't walk 100 yards even at a slow pace at a flat grade s stopping due to sob   rec Plan A = Automatic = stiolto 2 pffs each am  Work on inhaler technique:  Please schedule a follow up office visit in 6 weeks, call sooner if needed with all medications /inhalers/ solutions in hand so we can verify exactly what you are taking. This includes all medications from all doctors and over the counters      10/11/2017  f/u ov/Wert re:  ? Copd/ did not bring meds as requested / here to review studies only  Chief Complaint  Patient presents with  . Follow-up    PFT's done today. She states that her breathing is about the same "maybe a little better".  She has been coughing more and has some congestion in her chest and sinuses. She is coughing up clear to yellow sputum.  She is using her xopenex inhaler 2-3 x per day on average.   has not used xeponex yet today "you told me not to"  "You told me I hand allergies"  You told me I had  heart burn and now you are telling me I don't have copd when everyone else says I do"                 Objective:   Physical Exam    Angry thin wf nad    10/11/2017      82   07/04/17 84 lb 12.8 oz (38.5 kg)  11/22/12 86 lb 1.9 oz (39.1 kg)  10/19/12 81 lb (36.7 kg)    Vital signs reviewed  - Note on arrival 02 sats  98% on RA      Pt not examined    I personally reviewed images and agree with radiology impression as follows:  CXR:    07/04/17  no acute cardiopulmonary abnormality               Assessment:

## 2017-10-11 NOTE — Patient Instructions (Addendum)
Your lung function  today is perfectly normal without treatment prior to your test.   This means you do not have significant copd but it does not explain your symptoms at all and I am very concerned about that.   You will need to work with a specialist to sort out what is causing your symptoms and may need some form of inhaler but bevespi is not the best choice for you.   Please let me know where you wish [place] to send your records for further evaluation of your symptoms which you have not let me finish evaluating at this time

## 2017-10-11 NOTE — Assessment & Plan Note (Signed)
Spirometry 07/04/2017  Very poor effort/ no reliable data -  07/04/2017   Walked RA x 50' stopped due to   Dizzy and sob with sats 100%  - 07/04/2017    try stiolto 2 pffs each am > could not tol  - alpha one screening 07/04/2017  MM - 08/15/2017  After extensive coaching HFA effectiveness =    75% from a baseline of 25% > try bevespi 2bid - PFT's  10/11/2017  FEV1 2.31 (106 % ) ratio 90    p nothing  prior to study with DLCO  78 % corrects to 76 % for alv volume  With no curvature at all on exp f/v and flattening of Exp loop p saba    I had an extended final summary discussion with the patient reviewing all relevant studies completed to date and  lasting 15 to 20 minutes of a 25 minute visit on the following issues:   Your lung function  today is perfectly normal without treatment prior to your test.   This means you do not have significant copd but it does not explain your symptoms at all and I am very concerned about that.   You will need to work with a specialist to sort out what is causing your symptoms and may need some form of inhaler but bevespi is not the best choice for you (so ok to stop it and see if use of xopenex changes, which might indicate asthma and warrant symbiocrt or dulera trial)   Please let me know where you wish [place] to send your records for further evaluation of your symptoms which you have not let me finish evaluating at this time.   No f/u planned through this office as pt repeatedly berated me for lying to her and would not listen to discussion of what is meant by a "working dx" pending additional info, which we now have and need to more forward knowing a lot more now that we knew at previous ov's.   She genuinely seemed upset that her lungs were functioning normally suggesting a psychiatric component to her symptoms

## 2017-10-11 NOTE — Progress Notes (Signed)
PFT done today. 

## 2017-10-12 DIAGNOSIS — J449 Chronic obstructive pulmonary disease, unspecified: Secondary | ICD-10-CM | POA: Diagnosis not present

## 2017-10-27 DIAGNOSIS — M542 Cervicalgia: Secondary | ICD-10-CM | POA: Diagnosis not present

## 2017-10-27 DIAGNOSIS — J449 Chronic obstructive pulmonary disease, unspecified: Secondary | ICD-10-CM | POA: Diagnosis not present

## 2017-11-17 DIAGNOSIS — R300 Dysuria: Secondary | ICD-10-CM | POA: Diagnosis not present

## 2017-11-17 DIAGNOSIS — Z681 Body mass index (BMI) 19 or less, adult: Secondary | ICD-10-CM | POA: Diagnosis not present

## 2017-11-17 DIAGNOSIS — J449 Chronic obstructive pulmonary disease, unspecified: Secondary | ICD-10-CM | POA: Diagnosis not present

## 2017-11-29 DIAGNOSIS — N952 Postmenopausal atrophic vaginitis: Secondary | ICD-10-CM | POA: Diagnosis not present

## 2017-11-29 DIAGNOSIS — N3090 Cystitis, unspecified without hematuria: Secondary | ICD-10-CM | POA: Diagnosis not present

## 2017-12-06 DIAGNOSIS — Z720 Tobacco use: Secondary | ICD-10-CM | POA: Diagnosis not present

## 2017-12-06 DIAGNOSIS — Z681 Body mass index (BMI) 19 or less, adult: Secondary | ICD-10-CM | POA: Diagnosis not present

## 2017-12-06 DIAGNOSIS — M542 Cervicalgia: Secondary | ICD-10-CM | POA: Diagnosis not present

## 2017-12-06 DIAGNOSIS — R300 Dysuria: Secondary | ICD-10-CM | POA: Diagnosis not present

## 2017-12-06 DIAGNOSIS — J449 Chronic obstructive pulmonary disease, unspecified: Secondary | ICD-10-CM | POA: Diagnosis not present

## 2017-12-06 DIAGNOSIS — F419 Anxiety disorder, unspecified: Secondary | ICD-10-CM | POA: Diagnosis not present

## 2017-12-06 DIAGNOSIS — E7800 Pure hypercholesterolemia, unspecified: Secondary | ICD-10-CM | POA: Diagnosis not present

## 2017-12-06 DIAGNOSIS — M545 Low back pain: Secondary | ICD-10-CM | POA: Diagnosis not present

## 2017-12-14 DIAGNOSIS — F329 Major depressive disorder, single episode, unspecified: Secondary | ICD-10-CM | POA: Diagnosis not present

## 2017-12-14 DIAGNOSIS — J449 Chronic obstructive pulmonary disease, unspecified: Secondary | ICD-10-CM | POA: Diagnosis not present

## 2017-12-14 DIAGNOSIS — F419 Anxiety disorder, unspecified: Secondary | ICD-10-CM | POA: Diagnosis not present

## 2017-12-14 DIAGNOSIS — Z720 Tobacco use: Secondary | ICD-10-CM | POA: Diagnosis not present

## 2017-12-14 DIAGNOSIS — R5383 Other fatigue: Secondary | ICD-10-CM | POA: Diagnosis not present

## 2017-12-14 DIAGNOSIS — D649 Anemia, unspecified: Secondary | ICD-10-CM | POA: Diagnosis not present

## 2018-01-03 DIAGNOSIS — R509 Fever, unspecified: Secondary | ICD-10-CM | POA: Diagnosis not present

## 2018-01-03 DIAGNOSIS — Z681 Body mass index (BMI) 19 or less, adult: Secondary | ICD-10-CM | POA: Diagnosis not present

## 2018-01-03 DIAGNOSIS — J449 Chronic obstructive pulmonary disease, unspecified: Secondary | ICD-10-CM | POA: Diagnosis not present

## 2018-01-03 DIAGNOSIS — R300 Dysuria: Secondary | ICD-10-CM | POA: Diagnosis not present

## 2018-02-02 DIAGNOSIS — F419 Anxiety disorder, unspecified: Secondary | ICD-10-CM | POA: Diagnosis not present

## 2018-02-02 DIAGNOSIS — Z720 Tobacco use: Secondary | ICD-10-CM | POA: Diagnosis not present

## 2018-02-02 DIAGNOSIS — M542 Cervicalgia: Secondary | ICD-10-CM | POA: Diagnosis not present

## 2018-02-02 DIAGNOSIS — J449 Chronic obstructive pulmonary disease, unspecified: Secondary | ICD-10-CM | POA: Diagnosis not present

## 2018-02-02 DIAGNOSIS — Z1389 Encounter for screening for other disorder: Secondary | ICD-10-CM | POA: Diagnosis not present

## 2018-02-02 DIAGNOSIS — Z681 Body mass index (BMI) 19 or less, adult: Secondary | ICD-10-CM | POA: Diagnosis not present

## 2018-02-02 DIAGNOSIS — Z0001 Encounter for general adult medical examination with abnormal findings: Secondary | ICD-10-CM | POA: Diagnosis not present

## 2018-02-02 DIAGNOSIS — Z1331 Encounter for screening for depression: Secondary | ICD-10-CM | POA: Diagnosis not present

## 2018-02-02 DIAGNOSIS — M545 Low back pain: Secondary | ICD-10-CM | POA: Diagnosis not present

## 2018-03-14 DIAGNOSIS — Z681 Body mass index (BMI) 19 or less, adult: Secondary | ICD-10-CM | POA: Diagnosis not present

## 2018-03-14 DIAGNOSIS — H00014 Hordeolum externum left upper eyelid: Secondary | ICD-10-CM | POA: Diagnosis not present

## 2018-03-29 DIAGNOSIS — J449 Chronic obstructive pulmonary disease, unspecified: Secondary | ICD-10-CM | POA: Diagnosis not present

## 2018-03-29 DIAGNOSIS — M545 Low back pain: Secondary | ICD-10-CM | POA: Diagnosis not present

## 2018-03-29 DIAGNOSIS — Z720 Tobacco use: Secondary | ICD-10-CM | POA: Diagnosis not present

## 2018-03-29 DIAGNOSIS — Z681 Body mass index (BMI) 19 or less, adult: Secondary | ICD-10-CM | POA: Diagnosis not present

## 2018-03-29 DIAGNOSIS — M542 Cervicalgia: Secondary | ICD-10-CM | POA: Diagnosis not present

## 2018-03-29 DIAGNOSIS — F419 Anxiety disorder, unspecified: Secondary | ICD-10-CM | POA: Diagnosis not present

## 2018-05-29 DIAGNOSIS — Z720 Tobacco use: Secondary | ICD-10-CM | POA: Diagnosis not present

## 2018-05-29 DIAGNOSIS — H00011 Hordeolum externum right upper eyelid: Secondary | ICD-10-CM | POA: Diagnosis not present

## 2018-05-29 DIAGNOSIS — M545 Low back pain: Secondary | ICD-10-CM | POA: Diagnosis not present

## 2018-05-29 DIAGNOSIS — F419 Anxiety disorder, unspecified: Secondary | ICD-10-CM | POA: Diagnosis not present

## 2018-05-29 DIAGNOSIS — M79672 Pain in left foot: Secondary | ICD-10-CM | POA: Diagnosis not present

## 2018-05-29 DIAGNOSIS — M542 Cervicalgia: Secondary | ICD-10-CM | POA: Diagnosis not present

## 2018-05-29 DIAGNOSIS — J449 Chronic obstructive pulmonary disease, unspecified: Secondary | ICD-10-CM | POA: Diagnosis not present

## 2018-07-26 DIAGNOSIS — R300 Dysuria: Secondary | ICD-10-CM | POA: Diagnosis not present

## 2018-07-26 DIAGNOSIS — Z681 Body mass index (BMI) 19 or less, adult: Secondary | ICD-10-CM | POA: Diagnosis not present

## 2018-07-26 DIAGNOSIS — N3001 Acute cystitis with hematuria: Secondary | ICD-10-CM | POA: Diagnosis not present

## 2018-08-21 DIAGNOSIS — M542 Cervicalgia: Secondary | ICD-10-CM | POA: Diagnosis not present

## 2018-08-21 DIAGNOSIS — Z720 Tobacco use: Secondary | ICD-10-CM | POA: Diagnosis not present

## 2018-08-21 DIAGNOSIS — M545 Low back pain: Secondary | ICD-10-CM | POA: Diagnosis not present

## 2018-08-21 DIAGNOSIS — E782 Mixed hyperlipidemia: Secondary | ICD-10-CM | POA: Diagnosis not present

## 2018-08-21 DIAGNOSIS — Z681 Body mass index (BMI) 19 or less, adult: Secondary | ICD-10-CM | POA: Diagnosis not present

## 2018-08-21 DIAGNOSIS — F419 Anxiety disorder, unspecified: Secondary | ICD-10-CM | POA: Diagnosis not present

## 2018-08-21 DIAGNOSIS — J449 Chronic obstructive pulmonary disease, unspecified: Secondary | ICD-10-CM | POA: Diagnosis not present

## 2018-09-20 DIAGNOSIS — R922 Inconclusive mammogram: Secondary | ICD-10-CM | POA: Diagnosis not present

## 2018-09-20 DIAGNOSIS — N6314 Unspecified lump in the right breast, lower inner quadrant: Secondary | ICD-10-CM | POA: Diagnosis not present

## 2018-09-27 DIAGNOSIS — C50311 Malignant neoplasm of lower-inner quadrant of right female breast: Secondary | ICD-10-CM | POA: Diagnosis not present

## 2018-09-27 DIAGNOSIS — N6314 Unspecified lump in the right breast, lower inner quadrant: Secondary | ICD-10-CM | POA: Diagnosis not present

## 2018-09-27 DIAGNOSIS — R590 Localized enlarged lymph nodes: Secondary | ICD-10-CM | POA: Diagnosis not present

## 2018-10-02 DIAGNOSIS — F419 Anxiety disorder, unspecified: Secondary | ICD-10-CM | POA: Diagnosis not present

## 2018-10-02 DIAGNOSIS — C50911 Malignant neoplasm of unspecified site of right female breast: Secondary | ICD-10-CM | POA: Diagnosis not present

## 2018-10-10 DIAGNOSIS — Z170 Estrogen receptor positive status [ER+]: Secondary | ICD-10-CM | POA: Diagnosis not present

## 2018-10-10 DIAGNOSIS — C50911 Malignant neoplasm of unspecified site of right female breast: Secondary | ICD-10-CM | POA: Diagnosis not present

## 2018-10-16 ENCOUNTER — Other Ambulatory Visit (HOSPITAL_COMMUNITY): Payer: Self-pay | Admitting: General Surgery

## 2018-10-16 DIAGNOSIS — C50311 Malignant neoplasm of lower-inner quadrant of right female breast: Secondary | ICD-10-CM | POA: Diagnosis not present

## 2018-10-20 ENCOUNTER — Ambulatory Visit (HOSPITAL_COMMUNITY)
Admission: RE | Admit: 2018-10-20 | Discharge: 2018-10-20 | Disposition: A | Discharge status: Home / Self Care | Payer: Medicare Other | Source: Ambulatory Visit | Attending: General Surgery | Admitting: General Surgery

## 2018-10-20 DIAGNOSIS — C50311 Malignant neoplasm of lower-inner quadrant of right female breast: Secondary | ICD-10-CM | POA: Insufficient documentation

## 2018-10-20 DIAGNOSIS — N644 Mastodynia: Secondary | ICD-10-CM | POA: Diagnosis not present

## 2018-10-20 MED ORDER — GADOBUTROL 1 MMOL/ML IV SOLN
4.0000 mL | Freq: Once | INTRAVENOUS | Status: AC | PRN
  Administered 2018-10-20: 4 mL via INTRAVENOUS

## 2018-10-24 ENCOUNTER — Other Ambulatory Visit: Payer: Self-pay | Admitting: General Surgery

## 2018-10-24 DIAGNOSIS — C50311 Malignant neoplasm of lower-inner quadrant of right female breast: Secondary | ICD-10-CM

## 2018-10-24 DIAGNOSIS — Z170 Estrogen receptor positive status [ER+]: Principal | ICD-10-CM

## 2018-10-24 NOTE — Progress Notes (Signed)
Please let patient know the MRI looks good.  We can proceed with surgery and I have written orders.

## 2018-10-25 ENCOUNTER — Other Ambulatory Visit: Payer: Self-pay | Admitting: General Surgery

## 2018-10-25 DIAGNOSIS — C50311 Malignant neoplasm of lower-inner quadrant of right female breast: Secondary | ICD-10-CM

## 2018-10-25 DIAGNOSIS — Z170 Estrogen receptor positive status [ER+]: Principal | ICD-10-CM

## 2018-10-27 DIAGNOSIS — C50311 Malignant neoplasm of lower-inner quadrant of right female breast: Secondary | ICD-10-CM | POA: Diagnosis not present

## 2018-10-27 DIAGNOSIS — Z170 Estrogen receptor positive status [ER+]: Secondary | ICD-10-CM | POA: Diagnosis not present

## 2018-11-13 ENCOUNTER — Encounter (HOSPITAL_COMMUNITY): Payer: Self-pay

## 2018-11-13 NOTE — Pre-Procedure Instructions (Signed)
Amber Salazar  11/13/2018      [facility] - [address], [place], [place] - [place] [address] Phone: [phone] Fax: [phone]  [facility] [phone] - [place], [place] [address] [place] [address] [place] [address] Phone: [phone] Fax: [phone]    Your procedure is scheduled on Tuesday, January 14.  Report to [facility] Admitting at 5:30 AM                   Your surgery or procedure is scheduled for 7:30 A.M.   Call this number if you have problems the morning of surgery: [phone]  This is the number for the Pre- Surgical Desk.               For any other questions, please call [phone], Monday - Friday 8 AM - 4 PM.     Remember:  Do not eat  after midnight.  You may drink clear liquids until 4:30 AM .  Clear liquids allowed are:  Water, Juice (non-citric and without pulp), Carbonated beverages, Clear Tea, Black Coffee only, Plain Jell-O only, Gatorade and Plain Popsicles only                  Drink the Pre- Surgery Ensure Drink you were given by 4:30 AM. Please do not sip this drink.    Take these medicines the morning of surgery with A SIP OF WATER : NONE  Use Tiotropium Bromide Monohydrate Inhaler  If needed:  Use levalbuterol (XOPENEX HFA) inhaler, please bring it with you to the hospital  1 Week prior to surgery STOP taking Aspirin, Aspirin Products (Goody Powder, Excedrin Migraine), Ibuprofen (Advil), Naproxen (Aleve), Vitamins and Herbal Products (ie Fish Oil).  Special instructions:  [facility]- Preparing For Surgery  Before surgery, you can play an important role. Because skin is not sterile, your skin needs to be as free of germs as possible. You can reduce the number of germs on your skin by washing with CHG (chlorahexidine gluconate) Soap before surgery.  CHG is an antiseptic cleaner which kills germs and bonds with the skin to  continue killing germs even after washing.    Oral Hygiene is also important to reduce your risk of infection.  Remember - BRUSH YOUR TEETH THE MORNING OF SURGERY WITH YOUR REGULAR TOOTHPASTE  Please do not use if you have an allergy to CHG or antibacterial soaps. If your skin becomes reddened/irritated stop using the CHG.  Do not shave (including legs and underarms) for at least 48 hours prior to first CHG shower. It is OK to shave your face.  Please follow these instructions carefully.   1. Shower the NIGHT BEFORE SURGERY and the MORNING OF SURGERY with CHG.   2. If you chose to wash your hair, wash your hair first as usual with your normal shampoo.  3. After you shampoo,wash your face and private area with the soap you use at home, then rinse your hair and body thoroughly to remove the shampoo and soap.   4. Use CHG as you would any other liquid soap. You can apply CHG directly to the skin and wash gently with a scrungie or a clean washcloth.   5. Apply the CHG Soap to your body ONLY FROM THE NECK DOWN.  Do not use on open wounds or open sores.  Avoid contact with your eyes, ears, mouth and genitals (private parts).  6. Wash thoroughly, paying special attention to the area where your surgery will be performed.  7. Thoroughly rinse your body with warm water from the neck down.  8. DO NOT shower/wash with your normal soap after using and rinsing off the CHG Soap.  9. Pat yourself dry with a CLEAN TOWEL.  10. Wear CLEAN PAJAMAS to bed the night before surgery, wear comfortable clothes the morning of surgery  11. Place CLEAN SHEETS on your bed the night of your first shower and DO NOT SLEEP WITH PETS.    Day of Surgery:  Shower as instructed above Do not apply any deodorants/lotions, powders or colognes.  Please wear clean clothes to the hospital/surgery center.   Remember to brush your teeth WITH YOUR REGULAR TOOTHPASTE.  Do not wear jewelry, make-up or nail polish.  Do not  shave 48 hours prior to surgery.  Men may shave face and neck.  Do not bring valuables to the hospital.  [facility] is not responsible for any belongings or valuables.  Contacts, dentures or bridgework may not be worn into surgery.  Leave your suitcase in the car.  After surgery it may be brought to your room.  For patients admitted to the hospital, discharge time will be determined by your treatment team.  Patients discharged the day of surgery will not be allowed to drive home.   Please read over the following fact sheets that you were given.

## 2018-11-13 NOTE — Pre-Procedure Instructions (Signed)
Amber Salazar  11/13/2018      [facility] - [address], [place], [place] - [place] [address] Phone: [phone] Fax: [phone]  [facility] [phone] - [place], [place] [address] [place] [address] [place] [address] Phone: [phone] Fax: [phone]    Your procedure is scheduled on Tuesday, January 14.  Report to [facility] Admitting at 5:30 AM                   Your surgery or procedure is scheduled for 7:30 A.M.   Call this number if you have problems the morning of surgery: [phone]  This is the number for the Pre- Surgical Desk.               For any other questions, please call [phone], Monday - Friday 8 AM - 4 PM.     Remember:  Do not eat  after midnight.  You may drink clear liquids until 4:30 AM .  Clear liquids allowed are:  Water, Juice (non-citric and without pulp), Carbonated beverages, Clear Tea, Black Coffee only, Plain Jell-O only, Gatorade and Plain Popsicles only                  Drink the Pre- Surgery Ensure Drink you were given by 4:30 AM  Take these medicines the morning of surgery with A SIP OF WATER : Use Tiotropium Bromide Monohydrate Inhaler  If needed:  Use levalbuterol (XOPENEX HFA) inhaler, please bring it with you to the hospital    1 Week prior to surgery STOP taking Aspirin, Aspirin Products (Goody Powder, Excedrin Migraine), Ibuprofen (Advil), Naproxen (Aleve), Vitamins and Herbal Products (ie Fish Oil).  Special instructions:  [place]- Preparing For Surgery  Before surgery, you can play an important role. Because skin is not sterile, your skin needs to be as free of germs as possible. You can reduce the number of germs on your skin by washing with CHG (chlorahexidine gluconate) Soap before surgery.  CHG is an antiseptic cleaner which kills germs and bonds with the skin to continue killing germs even after washing.     Oral Hygiene is also important to reduce your risk of infection.  Remember - BRUSH YOUR TEETH THE MORNING OF SURGERY WITH YOUR REGULAR TOOTHPASTE  Please do not use if you have an allergy to CHG or antibacterial soaps. If your skin becomes reddened/irritated stop using the CHG.  Do not shave (including legs and underarms) for at least 48 hours prior to first CHG shower. It is OK to shave your face.  Please follow these instructions carefully.   1. Shower the NIGHT BEFORE SURGERY and the MORNING OF SURGERY with CHG.   2. If you chose to wash your hair, wash your hair first as usual with your normal shampoo.  3. After you shampoo,wash your face and private area with the soap you use at home, then rinse your hair and body thoroughly to remove the shampoo and soap.   4. Use CHG as you would any other liquid soap. You can apply CHG directly to the skin and wash gently with a scrungie or a clean washcloth.   5. Apply the CHG Soap to your body ONLY FROM THE NECK DOWN.  Do not use on open wounds or open sores. Avoid contact with your eyes, ears, mouth and  genitals (private parts).  6. Wash thoroughly, paying special attention to the area where your surgery will be performed.  7. Thoroughly rinse your body with warm water from the neck down.  8. DO NOT shower/wash with your normal soap after using and rinsing off the CHG Soap.  9. Pat yourself dry with a CLEAN TOWEL.  10. Wear CLEAN PAJAMAS to bed the night before surgery, wear comfortable clothes the morning of surgery  11. Place CLEAN SHEETS on your bed the night of your first shower and DO NOT SLEEP WITH PETS.    Day of Surgery:  Shower as instructed above Do not apply any deodorants/lotions, powders or colognes.  Please wear clean clothes to the hospital/surgery center.   Remember to brush your teeth WITH YOUR REGULAR TOOTHPASTE.  Do not wear jewelry, make-up or nail polish.  Do not shave 48 hours prior to surgery.  Men may shave  face and neck.  Do not bring valuables to the hospital.  [facility] is not responsible for any belongings or valuables.  Contacts, dentures or bridgework may not be worn into surgery.  Leave your suitcase in the car.  After surgery it may be brought to your room.  For patients admitted to the hospital, discharge time will be determined by your treatment team.  Patients discharged the day of surgery will not be allowed to drive home.   Please read over the following fact sheets that you were given.

## 2018-11-14 ENCOUNTER — Encounter (HOSPITAL_COMMUNITY): Payer: Self-pay

## 2018-11-14 ENCOUNTER — Encounter (HOSPITAL_COMMUNITY)
Admission: RE | Admit: 2018-11-14 | Discharge: 2018-11-14 | Disposition: A | Discharge status: Home / Self Care | Payer: Medicare Other | Source: Ambulatory Visit | Attending: General Surgery | Admitting: General Surgery

## 2018-11-14 ENCOUNTER — Other Ambulatory Visit: Payer: Self-pay

## 2018-11-14 DIAGNOSIS — Z01812 Encounter for preprocedural laboratory examination: Secondary | ICD-10-CM | POA: Diagnosis not present

## 2018-11-14 HISTORY — DX: Other chronic pain: G89.29

## 2018-11-14 HISTORY — DX: Dorsalgia, unspecified: M54.9

## 2018-11-14 HISTORY — DX: Other reaction to spinal and lumbar puncture: G97.1

## 2018-11-14 HISTORY — DX: Malignant (primary) neoplasm, unspecified: C80.1

## 2018-11-14 LAB — COMPREHENSIVE METABOLIC PANEL
ALBUMIN: 4.4 g/dL (ref 3.5–5.0)
ALT: 17 U/L (ref 0–44)
AST: 24 U/L (ref 15–41)
Alkaline Phosphatase: 68 U/L (ref 38–126)
Anion gap: 14 (ref 5–15)
BUN: 5 mg/dL — ABNORMAL LOW (ref 6–20)
CO2: 24 mmol/L (ref 22–32)
Calcium: 9.7 mg/dL (ref 8.9–10.3)
Chloride: 105 mmol/L (ref 98–111)
Creatinine, Ser: 0.87 mg/dL (ref 0.44–1.00)
GFR calc Af Amer: >60 mL/min (ref >60)
GFR calc non Af Amer: >60 mL/min (ref >60)
Glucose, Bld: 96 mg/dL (ref 70–99)
Potassium: 4.2 mmol/L (ref 3.5–5.1)
Sodium: 143 mmol/L (ref 135–145)
Total Bilirubin: 0.4 mg/dL (ref 0.3–1.2)
Total Protein: 7 g/dL (ref 6.5–8.1)

## 2018-11-14 LAB — CBC WITH DIFFERENTIAL/PLATELET
Abs Immature Granulocytes: 0.02 10*3/uL (ref 0.00–0.07)
BASOS ABS: 0.1 10*3/uL (ref 0.0–0.1)
Basophils Relative: 1 %
EOS PCT: 1 %
Eosinophils Absolute: 0.1 10*3/uL (ref 0.0–0.5)
HEMATOCRIT: 43.2 % (ref 36.0–46.0)
Hemoglobin: 14 g/dL (ref 12.0–15.0)
Immature Granulocytes: 0 %
LYMPHS ABS: 2 10*3/uL (ref 0.7–4.0)
Lymphocytes Relative: 25 %
MCH: 30.2 pg (ref 26.0–34.0)
MCHC: 32.4 g/dL (ref 30.0–36.0)
MCV: 93.1 fL (ref 80.0–100.0)
Monocytes Absolute: 0.5 10*3/uL (ref 0.1–1.0)
Monocytes Relative: 6 %
NRBC: 0 % (ref 0.0–0.2)
Neutro Abs: 5.3 10*3/uL (ref 1.7–7.7)
Neutrophils Relative %: 67 %
Platelets: 277 10*3/uL (ref 150–400)
RBC: 4.64 MIL/uL (ref 3.87–5.11)
RDW: 12.8 % (ref 11.5–15.5)
WBC: 8 10*3/uL (ref 4.0–10.5)

## 2018-11-14 NOTE — Progress Notes (Signed)
PCP - Clemmie Krill Cardiologist - denies  Chest x-ray - N/A EKG - N/A Stress Test - 5+ years ECHO - 20+ years ago Cardiac Cath - denies  Sleep Study - denies  Aspirin Instructions: N/A  Anesthesia review: No  Patient denies shortness of breath, fever, cough and chest pain at PAT appointment   Patient verbalized understanding of instructions that were given to them at the PAT appointment. Patient was also instructed that they will need to review over the PAT instructions again at home before surgery.

## 2018-11-15 DIAGNOSIS — J449 Chronic obstructive pulmonary disease, unspecified: Secondary | ICD-10-CM | POA: Diagnosis not present

## 2018-11-15 DIAGNOSIS — R300 Dysuria: Secondary | ICD-10-CM | POA: Diagnosis not present

## 2018-11-15 DIAGNOSIS — C50911 Malignant neoplasm of unspecified site of right female breast: Secondary | ICD-10-CM | POA: Diagnosis not present

## 2018-11-15 DIAGNOSIS — M545 Low back pain: Secondary | ICD-10-CM | POA: Diagnosis not present

## 2018-11-15 DIAGNOSIS — M542 Cervicalgia: Secondary | ICD-10-CM | POA: Diagnosis not present

## 2018-11-15 DIAGNOSIS — Z720 Tobacco use: Secondary | ICD-10-CM | POA: Diagnosis not present

## 2018-11-15 DIAGNOSIS — F419 Anxiety disorder, unspecified: Secondary | ICD-10-CM | POA: Diagnosis not present

## 2018-11-15 DIAGNOSIS — Z681 Body mass index (BMI) 19 or less, adult: Secondary | ICD-10-CM | POA: Diagnosis not present

## 2018-11-21 ENCOUNTER — Ambulatory Visit (HOSPITAL_COMMUNITY): Admission: RE | Admit: 2018-11-21 | Payer: Medicare Other | Source: Home / Self Care | Admitting: General Surgery

## 2018-11-21 ENCOUNTER — Encounter (HOSPITAL_COMMUNITY): Admission: RE | Payer: Self-pay | Source: Home / Self Care

## 2018-11-21 ENCOUNTER — Ambulatory Visit (HOSPITAL_COMMUNITY): Payer: Medicare Other

## 2018-11-21 SURGERY — BREAST LUMPECTOMY WITH RADIOACTIVE SEED AND SENTINEL LYMPH NODE BIOPSY
Anesthesia: General | Site: Breast | Laterality: Right

## 2018-11-23 DIAGNOSIS — C50911 Malignant neoplasm of unspecified site of right female breast: Secondary | ICD-10-CM | POA: Diagnosis not present

## 2018-11-24 DIAGNOSIS — C50811 Malignant neoplasm of overlapping sites of right female breast: Secondary | ICD-10-CM | POA: Diagnosis not present

## 2018-11-29 DIAGNOSIS — C50911 Malignant neoplasm of unspecified site of right female breast: Secondary | ICD-10-CM | POA: Diagnosis not present

## 2018-11-29 DIAGNOSIS — C50111 Malignant neoplasm of central portion of right female breast: Secondary | ICD-10-CM | POA: Diagnosis not present

## 2018-11-29 DIAGNOSIS — C50311 Malignant neoplasm of lower-inner quadrant of right female breast: Secondary | ICD-10-CM | POA: Diagnosis not present

## 2018-11-29 DIAGNOSIS — Z170 Estrogen receptor positive status [ER+]: Secondary | ICD-10-CM | POA: Diagnosis not present

## 2018-12-04 DIAGNOSIS — N644 Mastodynia: Secondary | ICD-10-CM | POA: Diagnosis not present

## 2018-12-04 DIAGNOSIS — Z170 Estrogen receptor positive status [ER+]: Secondary | ICD-10-CM | POA: Diagnosis not present

## 2018-12-04 DIAGNOSIS — L821 Other seborrheic keratosis: Secondary | ICD-10-CM | POA: Diagnosis not present

## 2018-12-04 DIAGNOSIS — C50311 Malignant neoplasm of lower-inner quadrant of right female breast: Secondary | ICD-10-CM | POA: Diagnosis not present

## 2018-12-05 ENCOUNTER — Other Ambulatory Visit: Payer: Self-pay | Admitting: Oncology

## 2018-12-05 DIAGNOSIS — C50311 Malignant neoplasm of lower-inner quadrant of right female breast: Secondary | ICD-10-CM

## 2018-12-05 DIAGNOSIS — Z170 Estrogen receptor positive status [ER+]: Secondary | ICD-10-CM

## 2018-12-07 ENCOUNTER — Other Ambulatory Visit: Payer: Self-pay | Admitting: Oncology

## 2018-12-07 ENCOUNTER — Other Ambulatory Visit (HOSPITAL_COMMUNITY): Payer: Self-pay | Admitting: Oncology

## 2018-12-07 DIAGNOSIS — C50311 Malignant neoplasm of lower-inner quadrant of right female breast: Secondary | ICD-10-CM

## 2018-12-07 DIAGNOSIS — Z170 Estrogen receptor positive status [ER+]: Principal | ICD-10-CM

## 2018-12-11 DIAGNOSIS — C50111 Malignant neoplasm of central portion of right female breast: Secondary | ICD-10-CM | POA: Diagnosis not present

## 2018-12-11 DIAGNOSIS — Z170 Estrogen receptor positive status [ER+]: Secondary | ICD-10-CM | POA: Diagnosis not present

## 2018-12-11 DIAGNOSIS — J439 Emphysema, unspecified: Secondary | ICD-10-CM | POA: Diagnosis not present

## 2018-12-11 DIAGNOSIS — C50311 Malignant neoplasm of lower-inner quadrant of right female breast: Secondary | ICD-10-CM | POA: Diagnosis not present

## 2018-12-11 DIAGNOSIS — C50911 Malignant neoplasm of unspecified site of right female breast: Secondary | ICD-10-CM | POA: Diagnosis not present

## 2018-12-14 ENCOUNTER — Ambulatory Visit (HOSPITAL_COMMUNITY)
Admission: RE | Admit: 2018-12-14 | Discharge: 2018-12-14 | Disposition: A | Discharge status: Home / Self Care | Payer: Medicare Other | Source: Ambulatory Visit | Attending: Oncology | Admitting: Oncology

## 2018-12-14 DIAGNOSIS — C50911 Malignant neoplasm of unspecified site of right female breast: Secondary | ICD-10-CM | POA: Diagnosis not present

## 2018-12-14 DIAGNOSIS — Z170 Estrogen receptor positive status [ER+]: Secondary | ICD-10-CM | POA: Insufficient documentation

## 2018-12-14 DIAGNOSIS — C50311 Malignant neoplasm of lower-inner quadrant of right female breast: Secondary | ICD-10-CM | POA: Diagnosis not present

## 2018-12-14 MED ORDER — GADOBUTROL 1 MMOL/ML IV SOLN
4.0000 mL | Freq: Once | INTRAVENOUS | Status: AC | PRN
  Administered 2018-12-14: 4 mL via INTRAVENOUS

## 2018-12-18 DIAGNOSIS — R6883 Chills (without fever): Secondary | ICD-10-CM | POA: Diagnosis not present

## 2018-12-18 DIAGNOSIS — J441 Chronic obstructive pulmonary disease with (acute) exacerbation: Secondary | ICD-10-CM | POA: Diagnosis not present

## 2018-12-18 DIAGNOSIS — N644 Mastodynia: Secondary | ICD-10-CM | POA: Diagnosis not present

## 2018-12-18 DIAGNOSIS — Z170 Estrogen receptor positive status [ER+]: Secondary | ICD-10-CM | POA: Diagnosis not present

## 2018-12-18 DIAGNOSIS — C50311 Malignant neoplasm of lower-inner quadrant of right female breast: Secondary | ICD-10-CM | POA: Diagnosis not present

## 2018-12-18 DIAGNOSIS — Z720 Tobacco use: Secondary | ICD-10-CM | POA: Diagnosis not present

## 2018-12-25 DIAGNOSIS — C50311 Malignant neoplasm of lower-inner quadrant of right female breast: Secondary | ICD-10-CM | POA: Diagnosis not present

## 2018-12-25 DIAGNOSIS — Z170 Estrogen receptor positive status [ER+]: Secondary | ICD-10-CM | POA: Diagnosis not present

## 2018-12-26 DIAGNOSIS — Z880 Allergy status to penicillin: Secondary | ICD-10-CM | POA: Diagnosis not present

## 2018-12-26 DIAGNOSIS — F17200 Nicotine dependence, unspecified, uncomplicated: Secondary | ICD-10-CM | POA: Diagnosis not present

## 2018-12-26 DIAGNOSIS — Z886 Allergy status to analgesic agent status: Secondary | ICD-10-CM | POA: Diagnosis not present

## 2018-12-26 DIAGNOSIS — J439 Emphysema, unspecified: Secondary | ICD-10-CM | POA: Diagnosis not present

## 2018-12-26 DIAGNOSIS — Z882 Allergy status to sulfonamides status: Secondary | ICD-10-CM | POA: Diagnosis not present

## 2018-12-26 DIAGNOSIS — Z170 Estrogen receptor positive status [ER+]: Secondary | ICD-10-CM | POA: Diagnosis not present

## 2018-12-26 DIAGNOSIS — Z808 Family history of malignant neoplasm of other organs or systems: Secondary | ICD-10-CM | POA: Diagnosis not present

## 2018-12-26 DIAGNOSIS — C50311 Malignant neoplasm of lower-inner quadrant of right female breast: Secondary | ICD-10-CM | POA: Diagnosis not present

## 2019-01-05 DIAGNOSIS — Z09 Encounter for follow-up examination after completed treatment for conditions other than malignant neoplasm: Secondary | ICD-10-CM | POA: Diagnosis not present

## 2019-01-05 DIAGNOSIS — C50311 Malignant neoplasm of lower-inner quadrant of right female breast: Secondary | ICD-10-CM | POA: Diagnosis not present

## 2019-01-05 DIAGNOSIS — Z170 Estrogen receptor positive status [ER+]: Secondary | ICD-10-CM | POA: Diagnosis not present

## 2019-01-11 DIAGNOSIS — Z720 Tobacco use: Secondary | ICD-10-CM | POA: Diagnosis not present

## 2019-01-11 DIAGNOSIS — C50311 Malignant neoplasm of lower-inner quadrant of right female breast: Secondary | ICD-10-CM | POA: Diagnosis not present

## 2019-01-11 DIAGNOSIS — J439 Emphysema, unspecified: Secondary | ICD-10-CM | POA: Diagnosis not present

## 2019-01-11 DIAGNOSIS — Z170 Estrogen receptor positive status [ER+]: Secondary | ICD-10-CM | POA: Diagnosis not present

## 2019-01-11 DIAGNOSIS — C50412 Malignant neoplasm of upper-outer quadrant of left female breast: Secondary | ICD-10-CM | POA: Diagnosis not present

## 2019-01-17 DIAGNOSIS — Z170 Estrogen receptor positive status [ER+]: Secondary | ICD-10-CM | POA: Diagnosis not present

## 2019-01-17 DIAGNOSIS — C50311 Malignant neoplasm of lower-inner quadrant of right female breast: Secondary | ICD-10-CM | POA: Diagnosis not present

## 2019-01-17 DIAGNOSIS — C50911 Malignant neoplasm of unspecified site of right female breast: Secondary | ICD-10-CM | POA: Diagnosis not present

## 2019-01-22 DIAGNOSIS — L570 Actinic keratosis: Secondary | ICD-10-CM | POA: Diagnosis not present

## 2019-01-22 DIAGNOSIS — D239 Other benign neoplasm of skin, unspecified: Secondary | ICD-10-CM | POA: Diagnosis not present

## 2019-01-22 DIAGNOSIS — L821 Other seborrheic keratosis: Secondary | ICD-10-CM | POA: Diagnosis not present

## 2019-01-22 DIAGNOSIS — D485 Neoplasm of uncertain behavior of skin: Secondary | ICD-10-CM | POA: Diagnosis not present

## 2019-01-22 DIAGNOSIS — L986 Other infiltrative disorders of the skin and subcutaneous tissue: Secondary | ICD-10-CM | POA: Diagnosis not present

## 2019-01-29 DIAGNOSIS — C50311 Malignant neoplasm of lower-inner quadrant of right female breast: Secondary | ICD-10-CM | POA: Diagnosis not present

## 2019-01-29 DIAGNOSIS — Z170 Estrogen receptor positive status [ER+]: Secondary | ICD-10-CM | POA: Diagnosis not present

## 2019-01-29 DIAGNOSIS — J441 Chronic obstructive pulmonary disease with (acute) exacerbation: Secondary | ICD-10-CM | POA: Diagnosis not present

## 2019-01-29 DIAGNOSIS — J019 Acute sinusitis, unspecified: Secondary | ICD-10-CM | POA: Diagnosis not present

## 2019-02-01 DIAGNOSIS — L905 Scar conditions and fibrosis of skin: Secondary | ICD-10-CM | POA: Diagnosis not present

## 2019-02-01 DIAGNOSIS — D485 Neoplasm of uncertain behavior of skin: Secondary | ICD-10-CM | POA: Diagnosis not present

## 2019-02-07 DIAGNOSIS — C50911 Malignant neoplasm of unspecified site of right female breast: Secondary | ICD-10-CM | POA: Diagnosis not present

## 2019-02-07 DIAGNOSIS — C50311 Malignant neoplasm of lower-inner quadrant of right female breast: Secondary | ICD-10-CM | POA: Diagnosis not present

## 2019-02-07 DIAGNOSIS — Z5329 Procedure and treatment not carried out because of patient's decision for other reasons: Secondary | ICD-10-CM | POA: Diagnosis not present

## 2019-02-07 DIAGNOSIS — Z170 Estrogen receptor positive status [ER+]: Secondary | ICD-10-CM | POA: Diagnosis not present

## 2019-02-15 DIAGNOSIS — Z4802 Encounter for removal of sutures: Secondary | ICD-10-CM | POA: Diagnosis not present

## 2019-02-21 DIAGNOSIS — Z720 Tobacco use: Secondary | ICD-10-CM | POA: Diagnosis not present

## 2019-02-21 DIAGNOSIS — R300 Dysuria: Secondary | ICD-10-CM | POA: Diagnosis not present

## 2019-02-21 DIAGNOSIS — M545 Low back pain: Secondary | ICD-10-CM | POA: Diagnosis not present

## 2019-02-21 DIAGNOSIS — M542 Cervicalgia: Secondary | ICD-10-CM | POA: Diagnosis not present

## 2019-02-21 DIAGNOSIS — C50911 Malignant neoplasm of unspecified site of right female breast: Secondary | ICD-10-CM | POA: Diagnosis not present

## 2019-02-21 DIAGNOSIS — J449 Chronic obstructive pulmonary disease, unspecified: Secondary | ICD-10-CM | POA: Diagnosis not present

## 2019-02-21 DIAGNOSIS — F419 Anxiety disorder, unspecified: Secondary | ICD-10-CM | POA: Diagnosis not present

## 2019-03-12 DIAGNOSIS — C50911 Malignant neoplasm of unspecified site of right female breast: Secondary | ICD-10-CM | POA: Diagnosis not present

## 2019-03-12 DIAGNOSIS — Z170 Estrogen receptor positive status [ER+]: Secondary | ICD-10-CM | POA: Diagnosis not present

## 2019-03-12 DIAGNOSIS — C50411 Malignant neoplasm of upper-outer quadrant of right female breast: Secondary | ICD-10-CM | POA: Diagnosis not present

## 2019-03-16 DIAGNOSIS — C50911 Malignant neoplasm of unspecified site of right female breast: Secondary | ICD-10-CM | POA: Diagnosis not present

## 2019-03-16 DIAGNOSIS — C50411 Malignant neoplasm of upper-outer quadrant of right female breast: Secondary | ICD-10-CM | POA: Diagnosis not present

## 2019-03-16 DIAGNOSIS — Z170 Estrogen receptor positive status [ER+]: Secondary | ICD-10-CM | POA: Diagnosis not present

## 2019-03-21 DIAGNOSIS — C50411 Malignant neoplasm of upper-outer quadrant of right female breast: Secondary | ICD-10-CM | POA: Diagnosis not present

## 2019-03-21 DIAGNOSIS — C50911 Malignant neoplasm of unspecified site of right female breast: Secondary | ICD-10-CM | POA: Diagnosis not present

## 2019-03-21 DIAGNOSIS — Z170 Estrogen receptor positive status [ER+]: Secondary | ICD-10-CM | POA: Diagnosis not present

## 2019-03-22 DIAGNOSIS — C50911 Malignant neoplasm of unspecified site of right female breast: Secondary | ICD-10-CM | POA: Diagnosis not present

## 2019-03-22 DIAGNOSIS — Z170 Estrogen receptor positive status [ER+]: Secondary | ICD-10-CM | POA: Diagnosis not present

## 2019-03-23 DIAGNOSIS — C50911 Malignant neoplasm of unspecified site of right female breast: Secondary | ICD-10-CM | POA: Diagnosis not present

## 2019-03-23 DIAGNOSIS — Z170 Estrogen receptor positive status [ER+]: Secondary | ICD-10-CM | POA: Diagnosis not present

## 2019-03-26 DIAGNOSIS — Z170 Estrogen receptor positive status [ER+]: Secondary | ICD-10-CM | POA: Diagnosis not present

## 2019-03-26 DIAGNOSIS — C50911 Malignant neoplasm of unspecified site of right female breast: Secondary | ICD-10-CM | POA: Diagnosis not present

## 2019-03-27 DIAGNOSIS — C50911 Malignant neoplasm of unspecified site of right female breast: Secondary | ICD-10-CM | POA: Diagnosis not present

## 2019-03-27 DIAGNOSIS — Z170 Estrogen receptor positive status [ER+]: Secondary | ICD-10-CM | POA: Diagnosis not present

## 2019-03-28 DIAGNOSIS — C50411 Malignant neoplasm of upper-outer quadrant of right female breast: Secondary | ICD-10-CM | POA: Diagnosis not present

## 2019-03-28 DIAGNOSIS — Z170 Estrogen receptor positive status [ER+]: Secondary | ICD-10-CM | POA: Diagnosis not present

## 2019-03-28 DIAGNOSIS — C50911 Malignant neoplasm of unspecified site of right female breast: Secondary | ICD-10-CM | POA: Diagnosis not present

## 2019-03-29 DIAGNOSIS — C50911 Malignant neoplasm of unspecified site of right female breast: Secondary | ICD-10-CM | POA: Diagnosis not present

## 2019-03-29 DIAGNOSIS — Z170 Estrogen receptor positive status [ER+]: Secondary | ICD-10-CM | POA: Diagnosis not present

## 2019-03-30 DIAGNOSIS — Z170 Estrogen receptor positive status [ER+]: Secondary | ICD-10-CM | POA: Diagnosis not present

## 2019-03-30 DIAGNOSIS — C50911 Malignant neoplasm of unspecified site of right female breast: Secondary | ICD-10-CM | POA: Diagnosis not present

## 2019-04-03 DIAGNOSIS — Z170 Estrogen receptor positive status [ER+]: Secondary | ICD-10-CM | POA: Diagnosis not present

## 2019-04-03 DIAGNOSIS — C50911 Malignant neoplasm of unspecified site of right female breast: Secondary | ICD-10-CM | POA: Diagnosis not present

## 2019-04-05 DIAGNOSIS — Z170 Estrogen receptor positive status [ER+]: Secondary | ICD-10-CM | POA: Diagnosis not present

## 2019-04-05 DIAGNOSIS — C50911 Malignant neoplasm of unspecified site of right female breast: Secondary | ICD-10-CM | POA: Diagnosis not present

## 2019-04-06 DIAGNOSIS — C50411 Malignant neoplasm of upper-outer quadrant of right female breast: Secondary | ICD-10-CM | POA: Diagnosis not present

## 2019-04-06 DIAGNOSIS — Z170 Estrogen receptor positive status [ER+]: Secondary | ICD-10-CM | POA: Diagnosis not present

## 2019-04-06 DIAGNOSIS — C50911 Malignant neoplasm of unspecified site of right female breast: Secondary | ICD-10-CM | POA: Diagnosis not present

## 2019-04-09 DIAGNOSIS — C50911 Malignant neoplasm of unspecified site of right female breast: Secondary | ICD-10-CM | POA: Diagnosis not present

## 2019-04-09 DIAGNOSIS — Z170 Estrogen receptor positive status [ER+]: Secondary | ICD-10-CM | POA: Diagnosis not present

## 2019-04-10 DIAGNOSIS — C50911 Malignant neoplasm of unspecified site of right female breast: Secondary | ICD-10-CM | POA: Diagnosis not present

## 2019-04-10 DIAGNOSIS — Z170 Estrogen receptor positive status [ER+]: Secondary | ICD-10-CM | POA: Diagnosis not present

## 2019-04-11 DIAGNOSIS — Z170 Estrogen receptor positive status [ER+]: Secondary | ICD-10-CM | POA: Diagnosis not present

## 2019-04-11 DIAGNOSIS — C50911 Malignant neoplasm of unspecified site of right female breast: Secondary | ICD-10-CM | POA: Diagnosis not present

## 2019-04-12 DIAGNOSIS — Z170 Estrogen receptor positive status [ER+]: Secondary | ICD-10-CM | POA: Diagnosis not present

## 2019-04-12 DIAGNOSIS — C50911 Malignant neoplasm of unspecified site of right female breast: Secondary | ICD-10-CM | POA: Diagnosis not present

## 2019-04-13 DIAGNOSIS — C50411 Malignant neoplasm of upper-outer quadrant of right female breast: Secondary | ICD-10-CM | POA: Diagnosis not present

## 2019-04-13 DIAGNOSIS — C50911 Malignant neoplasm of unspecified site of right female breast: Secondary | ICD-10-CM | POA: Diagnosis not present

## 2019-04-13 DIAGNOSIS — Z170 Estrogen receptor positive status [ER+]: Secondary | ICD-10-CM | POA: Diagnosis not present

## 2019-04-16 DIAGNOSIS — C50911 Malignant neoplasm of unspecified site of right female breast: Secondary | ICD-10-CM | POA: Diagnosis not present

## 2019-04-16 DIAGNOSIS — Z170 Estrogen receptor positive status [ER+]: Secondary | ICD-10-CM | POA: Diagnosis not present

## 2019-05-02 DIAGNOSIS — J441 Chronic obstructive pulmonary disease with (acute) exacerbation: Secondary | ICD-10-CM | POA: Diagnosis not present

## 2019-05-15 DIAGNOSIS — C50311 Malignant neoplasm of lower-inner quadrant of right female breast: Secondary | ICD-10-CM | POA: Diagnosis not present

## 2019-05-15 DIAGNOSIS — Z170 Estrogen receptor positive status [ER+]: Secondary | ICD-10-CM | POA: Diagnosis not present

## 2019-06-14 DIAGNOSIS — J441 Chronic obstructive pulmonary disease with (acute) exacerbation: Secondary | ICD-10-CM | POA: Diagnosis not present

## 2019-06-28 DIAGNOSIS — N952 Postmenopausal atrophic vaginitis: Secondary | ICD-10-CM | POA: Diagnosis not present

## 2019-06-28 DIAGNOSIS — Z124 Encounter for screening for malignant neoplasm of cervix: Secondary | ICD-10-CM | POA: Diagnosis not present

## 2019-06-28 DIAGNOSIS — C50311 Malignant neoplasm of lower-inner quadrant of right female breast: Secondary | ICD-10-CM | POA: Diagnosis not present

## 2019-06-28 DIAGNOSIS — Z01411 Encounter for gynecological examination (general) (routine) with abnormal findings: Secondary | ICD-10-CM | POA: Diagnosis not present

## 2019-07-06 DIAGNOSIS — Z170 Estrogen receptor positive status [ER+]: Secondary | ICD-10-CM | POA: Diagnosis not present

## 2019-07-06 DIAGNOSIS — C50311 Malignant neoplasm of lower-inner quadrant of right female breast: Secondary | ICD-10-CM | POA: Diagnosis not present

## 2019-08-08 DIAGNOSIS — R350 Frequency of micturition: Secondary | ICD-10-CM | POA: Diagnosis not present

## 2019-08-08 DIAGNOSIS — Z681 Body mass index (BMI) 19 or less, adult: Secondary | ICD-10-CM | POA: Diagnosis not present

## 2019-08-08 DIAGNOSIS — R300 Dysuria: Secondary | ICD-10-CM | POA: Diagnosis not present

## 2019-08-15 DIAGNOSIS — Z170 Estrogen receptor positive status [ER+]: Secondary | ICD-10-CM | POA: Diagnosis not present

## 2019-08-15 DIAGNOSIS — C50311 Malignant neoplasm of lower-inner quadrant of right female breast: Secondary | ICD-10-CM | POA: Diagnosis not present

## 2019-09-06 DIAGNOSIS — Z202 Contact with and (suspected) exposure to infections with a predominantly sexual mode of transmission: Secondary | ICD-10-CM | POA: Diagnosis not present

## 2019-09-06 DIAGNOSIS — Z7251 High risk heterosexual behavior: Secondary | ICD-10-CM | POA: Diagnosis not present

## 2019-09-06 DIAGNOSIS — R87615 Unsatisfactory cytologic smear of cervix: Secondary | ICD-10-CM | POA: Diagnosis not present

## 2019-10-01 DIAGNOSIS — Z681 Body mass index (BMI) 19 or less, adult: Secondary | ICD-10-CM | POA: Diagnosis not present

## 2019-10-01 DIAGNOSIS — J441 Chronic obstructive pulmonary disease with (acute) exacerbation: Secondary | ICD-10-CM | POA: Diagnosis not present

## 2019-10-02 DIAGNOSIS — R0989 Other specified symptoms and signs involving the circulatory and respiratory systems: Secondary | ICD-10-CM | POA: Diagnosis not present

## 2019-10-02 DIAGNOSIS — R519 Headache, unspecified: Secondary | ICD-10-CM | POA: Diagnosis not present

## 2019-10-08 DIAGNOSIS — J441 Chronic obstructive pulmonary disease with (acute) exacerbation: Secondary | ICD-10-CM | POA: Diagnosis not present

## 2019-10-08 DIAGNOSIS — F419 Anxiety disorder, unspecified: Secondary | ICD-10-CM | POA: Diagnosis not present

## 2019-10-11 DIAGNOSIS — Z923 Personal history of irradiation: Secondary | ICD-10-CM | POA: Diagnosis not present

## 2019-10-11 DIAGNOSIS — Z9011 Acquired absence of right breast and nipple: Secondary | ICD-10-CM | POA: Diagnosis not present

## 2019-10-11 DIAGNOSIS — Z853 Personal history of malignant neoplasm of breast: Secondary | ICD-10-CM | POA: Diagnosis not present

## 2019-10-11 DIAGNOSIS — Z170 Estrogen receptor positive status [ER+]: Secondary | ICD-10-CM | POA: Diagnosis not present

## 2019-10-11 DIAGNOSIS — C50311 Malignant neoplasm of lower-inner quadrant of right female breast: Secondary | ICD-10-CM | POA: Diagnosis not present

## 2019-10-11 DIAGNOSIS — F329 Major depressive disorder, single episode, unspecified: Secondary | ICD-10-CM | POA: Diagnosis not present

## 2019-10-11 DIAGNOSIS — J439 Emphysema, unspecified: Secondary | ICD-10-CM | POA: Diagnosis not present

## 2019-10-11 DIAGNOSIS — R922 Inconclusive mammogram: Secondary | ICD-10-CM | POA: Diagnosis not present

## 2019-11-22 IMAGING — MR MR BILATERAL BREAST WITHOUT AND WITH CONTRAST
8 of 10 series · 31 of 48 positions shown · IV contrast (4ml gadavist)
Comparison: Previous exam(s).

CLINICAL DATA: Recent breast cancer diagnosis. Pain RIGHT breast
and axilla.

LABS:  Not applicable
EXAM:
BILATERAL BREAST MRI WITH AND WITHOUT CONTRAST
TECHNIQUE: Multiplanar, multisequence MR images of both breasts were obtained
prior to and following the intravenous administration of 4 ml of
Gadavist

[Series 2: T2 · axial · 4.0mm · 0.91mm/px · 1 of 43 slices shown]
[im 1/43]
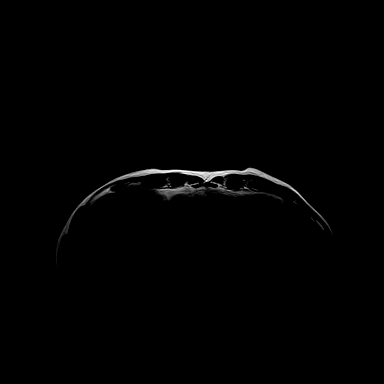

[Series 3: t2_tirm_tra ipat (a-p) · axial · 3.0mm · 0.68mm/px · 1 of 55 slices shown]
[im 1/55]
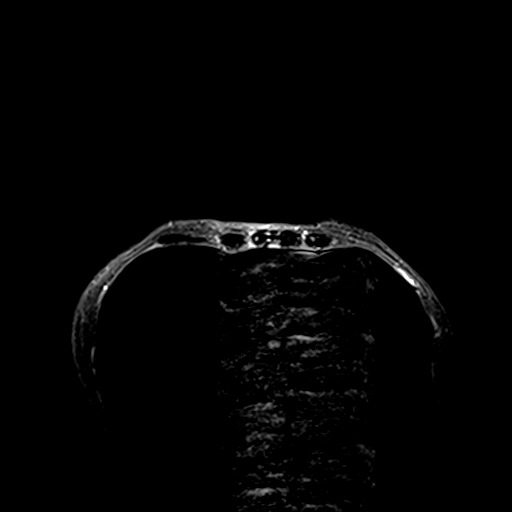

[Series 4: fl3d pre-cm no · axial · non-contrast · 1.1mm · 0.78mm/px · z∈[-66,+127]mm · 5 of 176 slices shown]
[im 1/176]
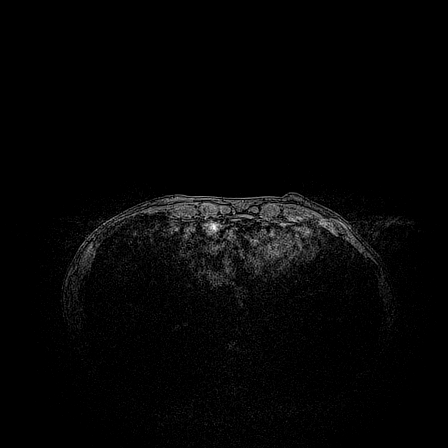
[im 44/176]
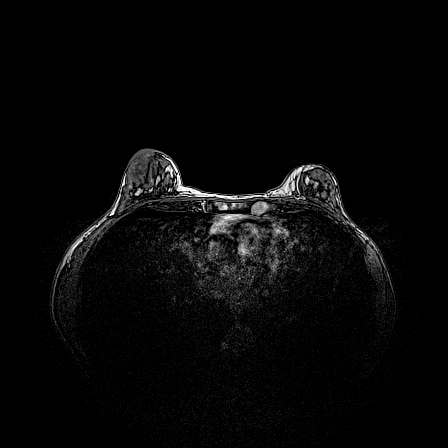
[im 88/176]
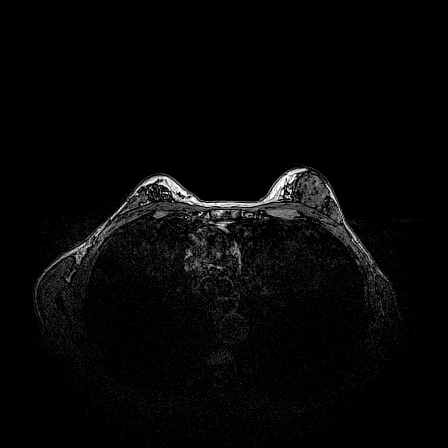
[im 132/176]
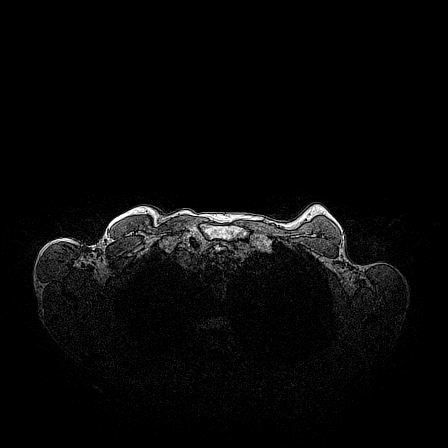
[im 176/176]
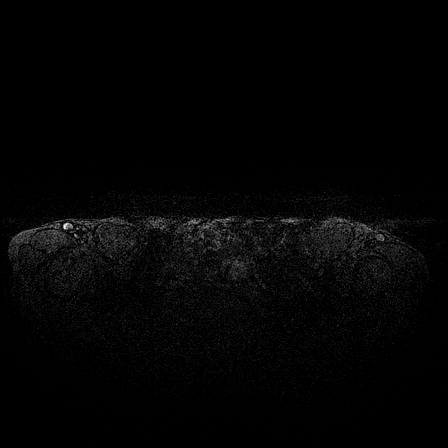

[Series 5: fl3d pre-cm · axial · non-contrast · 1.3mm · 0.91mm/px · z∈[-73,+134]mm · 5 of 160 slices shown]
[im 1/160]
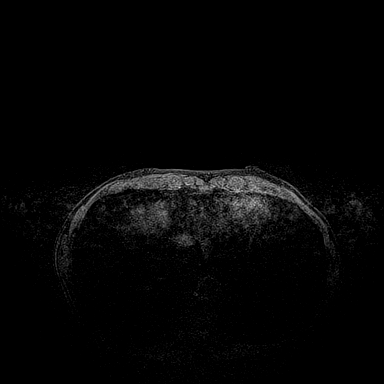
[im 40/160]
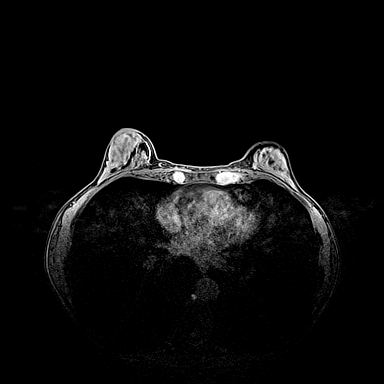
[im 80/160]
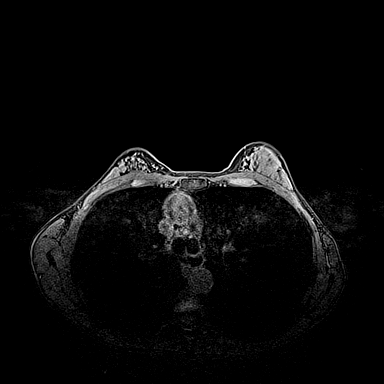
[im 120/160]
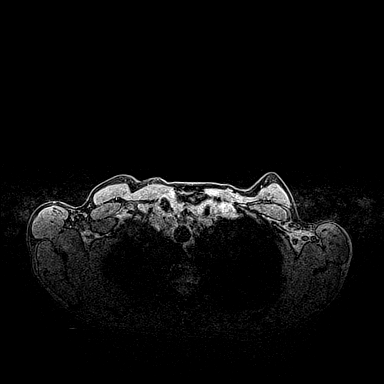
[im 160/160]
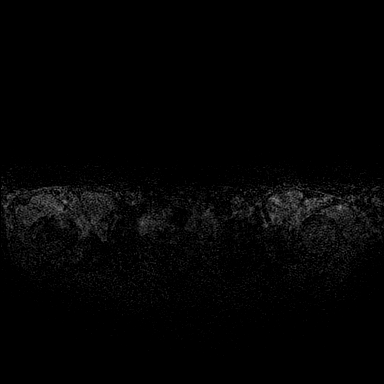

[Series 6: fl3d dynamic post · axial · 1.3mm · 0.91mm/px · z∈[-73,+134]mm · 6 of 160 slices shown (1 of 3)]
[im 1/160]
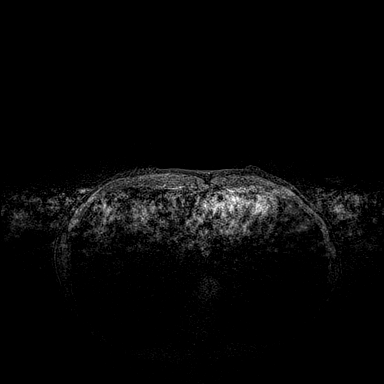
[im 32/160]
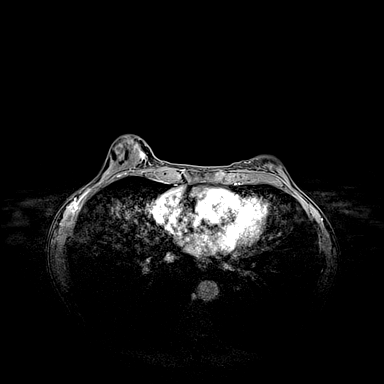
[im 64/160]
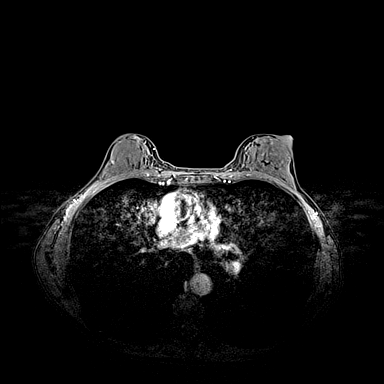
[im 96/160]
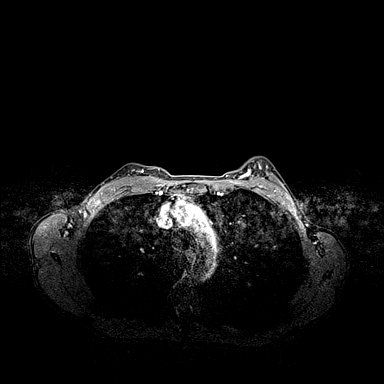
[im 128/160]
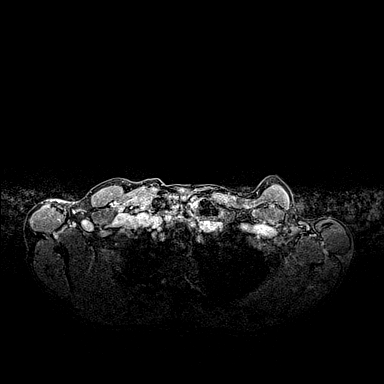
[im 160/160]
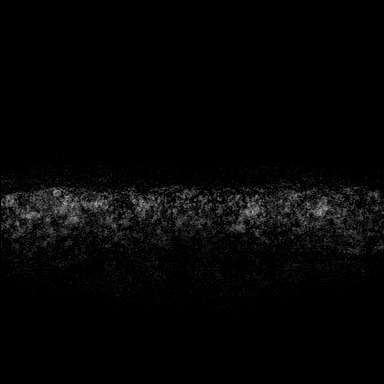

[Series 7: fl3d dynamic post · axial · 1.3mm · 0.91mm/px · z∈[-73,+134]mm · 6 of 160 slices shown (2 of 3)]
[im 1/160]
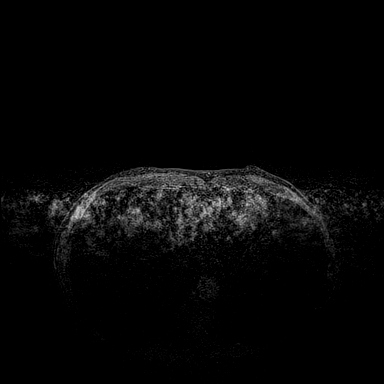
[im 32/160]
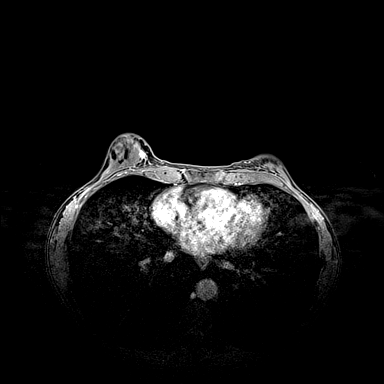
[im 64/160]
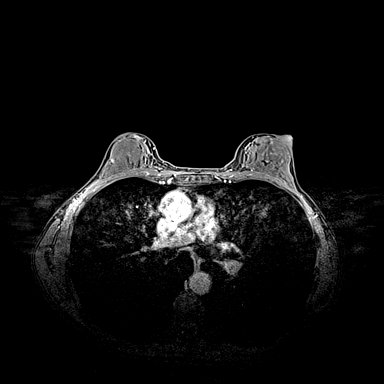
[im 96/160]
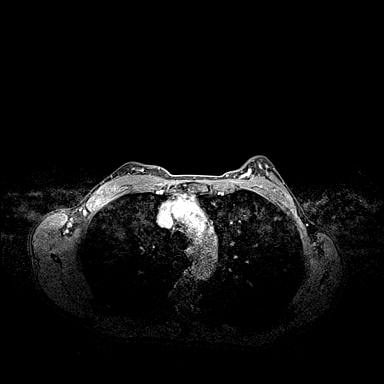
[im 128/160]
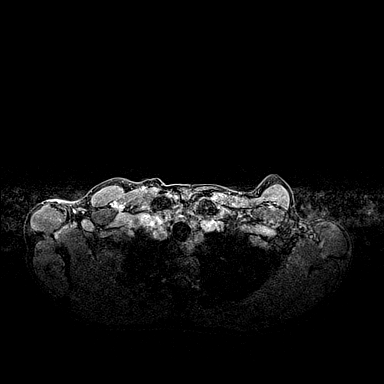
[im 160/160]
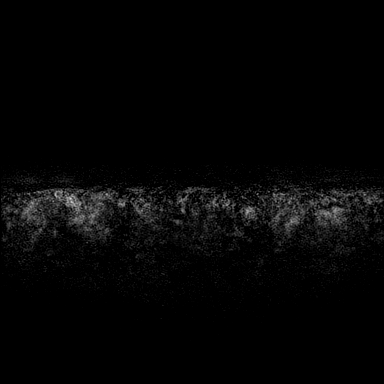

[Series 10: fl3d dynamic post · axial · 1.3mm · 0.91mm/px · z∈[-73,+134]mm · 6 of 160 slices shown (3 of 3)]
[im 1/160]
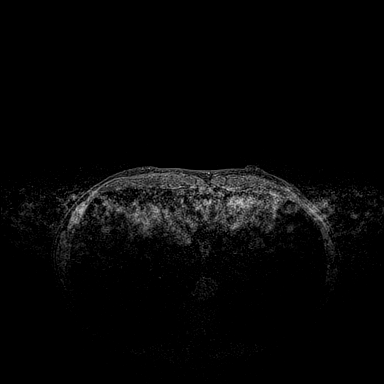
[im 32/160]
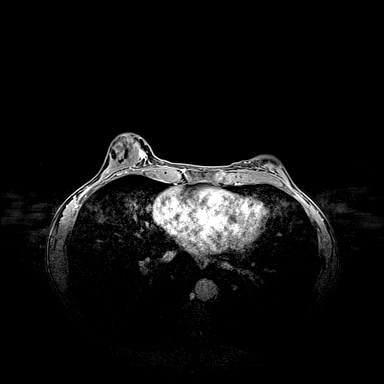
[im 64/160]
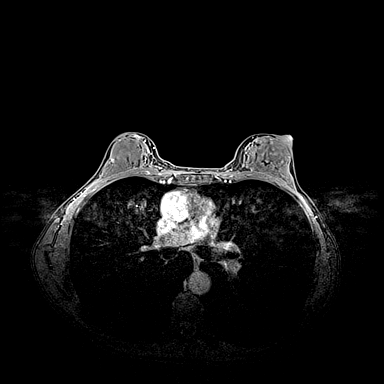
[im 96/160]
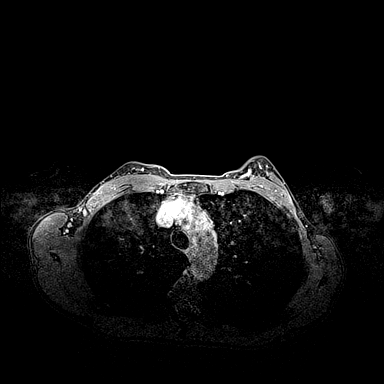
[im 128/160]
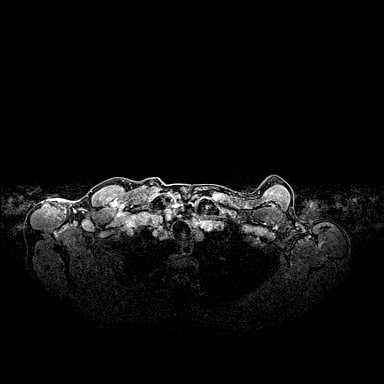
[im 160/160]
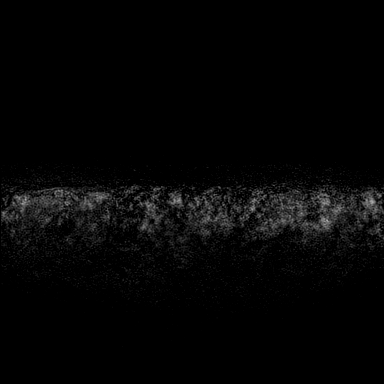

[Series 100: 20 sec sub · axial · 1.3mm · 0.91mm/px · 1 of 160 slices shown]
[im 1/160]
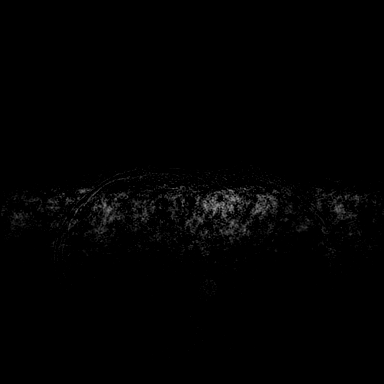

[31 of 48 positions shown; findings below may reference images not displayed]

Three-dimensional MR images were rendered by post-processing of the
original MR data on an independent workstation. The
three-dimensional MR images were interpreted, and findings are
reported in the following complete MRI report for this study. Three
dimensional images were evaluated at the independent [employer]
workstation
FINDINGS: Breast composition: d. Extreme fibroglandular tissue.

Background parenchymal enhancement: Mild

Right breast: Spiculated mass within the lower inner quadrant of the
RIGHT breast, at posterior depth, measuring 1 x 0.9 cm, with
associated architectural distortion (series 100, image 127). The
mass does not abut or involve the underlying pectoralis muscle.

There are no additional suspicious enhancing masses, non-mass
enhancement or secondary signs of malignancy within the RIGHT
breast.

Left breast: No suspicious enhancing mass, non-mass enhancement or
secondary signs of malignancy within the LEFT breast.

Lymph nodes: No enlarged or morphologically abnormal lymph nodes
identified within the bilateral axillary or internal mammary chain
regions. Mildly prominent lymph node in the RIGHT axilla,
corresponding to the recently biopsied lymph node (with benign
pathology result).

Ancillary findings:  None.
IMPRESSION: 1. Biopsy-proven cancer (spiculated mass) within the lower inner
quadrant of the RIGHT breast, at posterior depth, measuring 1 cm.
Ultrasound-guided biopsy report of 09/27/2018 states pathology
result as invasive ductal carcinoma. The mass does not abut or
involve the underlying pectoralis muscle.
2. No evidence of multicentric disease within the RIGHT breast.
3. No evidence of contralateral disease within the LEFT breast.
4. No enlarged or morphologically abnormal lymph nodes.

RECOMMENDATION:
Per current treatment plan for patient's known RIGHT breast cancer.

BI-RADS CATEGORY  6: Known biopsy-proven malignancy.

## 2019-11-27 DIAGNOSIS — J029 Acute pharyngitis, unspecified: Secondary | ICD-10-CM | POA: Diagnosis not present

## 2019-11-27 DIAGNOSIS — J019 Acute sinusitis, unspecified: Secondary | ICD-10-CM | POA: Diagnosis not present

## 2019-11-27 DIAGNOSIS — Z681 Body mass index (BMI) 19 or less, adult: Secondary | ICD-10-CM | POA: Diagnosis not present

## 2019-12-19 DIAGNOSIS — Z9889 Other specified postprocedural states: Secondary | ICD-10-CM | POA: Diagnosis not present

## 2019-12-19 DIAGNOSIS — C50311 Malignant neoplasm of lower-inner quadrant of right female breast: Secondary | ICD-10-CM | POA: Diagnosis not present

## 2019-12-19 DIAGNOSIS — M542 Cervicalgia: Secondary | ICD-10-CM | POA: Diagnosis not present

## 2019-12-19 DIAGNOSIS — J449 Chronic obstructive pulmonary disease, unspecified: Secondary | ICD-10-CM | POA: Diagnosis not present

## 2019-12-19 DIAGNOSIS — Z923 Personal history of irradiation: Secondary | ICD-10-CM | POA: Diagnosis not present

## 2019-12-19 DIAGNOSIS — Z170 Estrogen receptor positive status [ER+]: Secondary | ICD-10-CM | POA: Diagnosis not present

## 2019-12-19 DIAGNOSIS — G8929 Other chronic pain: Secondary | ICD-10-CM | POA: Diagnosis not present

## 2019-12-26 DIAGNOSIS — R300 Dysuria: Secondary | ICD-10-CM | POA: Diagnosis not present

## 2020-01-22 DIAGNOSIS — D2272 Melanocytic nevi of left lower limb, including hip: Secondary | ICD-10-CM | POA: Diagnosis not present

## 2020-01-22 DIAGNOSIS — L818 Other specified disorders of pigmentation: Secondary | ICD-10-CM | POA: Diagnosis not present

## 2020-01-22 DIAGNOSIS — L821 Other seborrheic keratosis: Secondary | ICD-10-CM | POA: Diagnosis not present

## 2020-01-22 DIAGNOSIS — D485 Neoplasm of uncertain behavior of skin: Secondary | ICD-10-CM | POA: Diagnosis not present

## 2020-01-22 DIAGNOSIS — L570 Actinic keratosis: Secondary | ICD-10-CM | POA: Diagnosis not present

## 2020-01-22 DIAGNOSIS — I781 Nevus, non-neoplastic: Secondary | ICD-10-CM | POA: Diagnosis not present

## 2020-01-29 DIAGNOSIS — L814 Other melanin hyperpigmentation: Secondary | ICD-10-CM | POA: Diagnosis not present

## 2020-01-29 DIAGNOSIS — L570 Actinic keratosis: Secondary | ICD-10-CM | POA: Diagnosis not present

## 2020-02-06 DIAGNOSIS — I10 Essential (primary) hypertension: Secondary | ICD-10-CM | POA: Diagnosis not present

## 2020-02-06 DIAGNOSIS — E7849 Other hyperlipidemia: Secondary | ICD-10-CM | POA: Diagnosis not present

## 2020-02-21 DIAGNOSIS — Z20828 Contact with and (suspected) exposure to other viral communicable diseases: Secondary | ICD-10-CM | POA: Diagnosis not present

## 2020-02-21 DIAGNOSIS — J441 Chronic obstructive pulmonary disease with (acute) exacerbation: Secondary | ICD-10-CM | POA: Diagnosis not present

## 2020-02-21 DIAGNOSIS — R5383 Other fatigue: Secondary | ICD-10-CM | POA: Diagnosis not present

## 2020-02-21 DIAGNOSIS — Z681 Body mass index (BMI) 19 or less, adult: Secondary | ICD-10-CM | POA: Diagnosis not present

## 2020-02-21 DIAGNOSIS — Z20822 Contact with and (suspected) exposure to covid-19: Secondary | ICD-10-CM | POA: Diagnosis not present

## 2020-03-26 DIAGNOSIS — R300 Dysuria: Secondary | ICD-10-CM | POA: Diagnosis not present

## 2020-03-26 DIAGNOSIS — Z681 Body mass index (BMI) 19 or less, adult: Secondary | ICD-10-CM | POA: Diagnosis not present

## 2020-03-26 DIAGNOSIS — R42 Dizziness and giddiness: Secondary | ICD-10-CM | POA: Diagnosis not present

## 2020-03-26 DIAGNOSIS — R079 Chest pain, unspecified: Secondary | ICD-10-CM | POA: Diagnosis not present

## 2020-04-10 DIAGNOSIS — Z170 Estrogen receptor positive status [ER+]: Secondary | ICD-10-CM | POA: Diagnosis not present

## 2020-04-10 DIAGNOSIS — C50311 Malignant neoplasm of lower-inner quadrant of right female breast: Secondary | ICD-10-CM | POA: Diagnosis not present

## 2020-04-18 DIAGNOSIS — Z923 Personal history of irradiation: Secondary | ICD-10-CM | POA: Diagnosis not present

## 2020-04-18 DIAGNOSIS — Z9889 Other specified postprocedural states: Secondary | ICD-10-CM | POA: Diagnosis not present

## 2020-04-18 DIAGNOSIS — G8929 Other chronic pain: Secondary | ICD-10-CM | POA: Diagnosis not present

## 2020-04-18 DIAGNOSIS — M542 Cervicalgia: Secondary | ICD-10-CM | POA: Diagnosis not present

## 2020-04-18 DIAGNOSIS — C50311 Malignant neoplasm of lower-inner quadrant of right female breast: Secondary | ICD-10-CM | POA: Diagnosis not present

## 2020-04-18 DIAGNOSIS — Z170 Estrogen receptor positive status [ER+]: Secondary | ICD-10-CM | POA: Diagnosis not present

## 2020-04-18 DIAGNOSIS — J449 Chronic obstructive pulmonary disease, unspecified: Secondary | ICD-10-CM | POA: Diagnosis not present

## 2020-04-23 DIAGNOSIS — Z853 Personal history of malignant neoplasm of breast: Secondary | ICD-10-CM | POA: Diagnosis not present

## 2020-04-23 DIAGNOSIS — N6001 Solitary cyst of right breast: Secondary | ICD-10-CM | POA: Diagnosis not present

## 2020-04-24 DIAGNOSIS — M542 Cervicalgia: Secondary | ICD-10-CM | POA: Diagnosis not present

## 2020-04-24 DIAGNOSIS — C50911 Malignant neoplasm of unspecified site of right female breast: Secondary | ICD-10-CM | POA: Diagnosis not present

## 2020-04-24 DIAGNOSIS — J449 Chronic obstructive pulmonary disease, unspecified: Secondary | ICD-10-CM | POA: Diagnosis not present

## 2020-04-24 DIAGNOSIS — H6091 Unspecified otitis externa, right ear: Secondary | ICD-10-CM | POA: Diagnosis not present

## 2020-04-24 DIAGNOSIS — G43919 Migraine, unspecified, intractable, without status migrainosus: Secondary | ICD-10-CM | POA: Diagnosis not present

## 2020-04-24 DIAGNOSIS — F329 Major depressive disorder, single episode, unspecified: Secondary | ICD-10-CM | POA: Diagnosis not present

## 2020-04-24 DIAGNOSIS — Z720 Tobacco use: Secondary | ICD-10-CM | POA: Diagnosis not present

## 2020-04-24 DIAGNOSIS — F419 Anxiety disorder, unspecified: Secondary | ICD-10-CM | POA: Diagnosis not present

## 2020-05-29 DIAGNOSIS — Z20828 Contact with and (suspected) exposure to other viral communicable diseases: Secondary | ICD-10-CM | POA: Diagnosis not present

## 2020-05-29 DIAGNOSIS — Z20822 Contact with and (suspected) exposure to covid-19: Secondary | ICD-10-CM | POA: Diagnosis not present

## 2020-05-29 DIAGNOSIS — R6883 Chills (without fever): Secondary | ICD-10-CM | POA: Diagnosis not present

## 2020-06-05 DIAGNOSIS — J019 Acute sinusitis, unspecified: Secondary | ICD-10-CM | POA: Diagnosis not present

## 2020-06-05 DIAGNOSIS — Z20828 Contact with and (suspected) exposure to other viral communicable diseases: Secondary | ICD-10-CM | POA: Diagnosis not present

## 2020-07-07 DIAGNOSIS — Z23 Encounter for immunization: Secondary | ICD-10-CM | POA: Diagnosis not present

## 2020-07-25 DIAGNOSIS — J441 Chronic obstructive pulmonary disease with (acute) exacerbation: Secondary | ICD-10-CM | POA: Diagnosis not present

## 2020-08-07 DIAGNOSIS — Z87891 Personal history of nicotine dependence: Secondary | ICD-10-CM | POA: Diagnosis not present

## 2020-08-07 DIAGNOSIS — J441 Chronic obstructive pulmonary disease with (acute) exacerbation: Secondary | ICD-10-CM | POA: Diagnosis not present

## 2020-08-07 DIAGNOSIS — M5430 Sciatica, unspecified side: Secondary | ICD-10-CM | POA: Diagnosis not present

## 2020-08-07 DIAGNOSIS — J449 Chronic obstructive pulmonary disease, unspecified: Secondary | ICD-10-CM | POA: Diagnosis not present

## 2020-08-08 DIAGNOSIS — Z681 Body mass index (BMI) 19 or less, adult: Secondary | ICD-10-CM | POA: Diagnosis not present

## 2020-08-08 DIAGNOSIS — Z0001 Encounter for general adult medical examination with abnormal findings: Secondary | ICD-10-CM | POA: Diagnosis not present

## 2020-08-08 DIAGNOSIS — M542 Cervicalgia: Secondary | ICD-10-CM | POA: Diagnosis not present

## 2020-08-08 DIAGNOSIS — G43919 Migraine, unspecified, intractable, without status migrainosus: Secondary | ICD-10-CM | POA: Diagnosis not present

## 2020-08-08 DIAGNOSIS — C50911 Malignant neoplasm of unspecified site of right female breast: Secondary | ICD-10-CM | POA: Diagnosis not present

## 2020-08-08 DIAGNOSIS — F419 Anxiety disorder, unspecified: Secondary | ICD-10-CM | POA: Diagnosis not present

## 2020-08-08 DIAGNOSIS — Z720 Tobacco use: Secondary | ICD-10-CM | POA: Diagnosis not present

## 2020-08-08 DIAGNOSIS — Z20822 Contact with and (suspected) exposure to covid-19: Secondary | ICD-10-CM | POA: Diagnosis not present

## 2020-08-08 DIAGNOSIS — F329 Major depressive disorder, single episode, unspecified: Secondary | ICD-10-CM | POA: Diagnosis not present

## 2020-08-08 DIAGNOSIS — Z20828 Contact with and (suspected) exposure to other viral communicable diseases: Secondary | ICD-10-CM | POA: Diagnosis not present

## 2020-08-08 DIAGNOSIS — M545 Low back pain, unspecified: Secondary | ICD-10-CM | POA: Diagnosis not present

## 2020-08-08 DIAGNOSIS — J449 Chronic obstructive pulmonary disease, unspecified: Secondary | ICD-10-CM | POA: Diagnosis not present

## 2020-08-15 DIAGNOSIS — I700 Atherosclerosis of aorta: Secondary | ICD-10-CM | POA: Diagnosis not present

## 2020-08-15 DIAGNOSIS — J439 Emphysema, unspecified: Secondary | ICD-10-CM | POA: Diagnosis not present

## 2020-08-15 DIAGNOSIS — J984 Other disorders of lung: Secondary | ICD-10-CM | POA: Diagnosis not present

## 2020-08-15 DIAGNOSIS — R911 Solitary pulmonary nodule: Secondary | ICD-10-CM | POA: Diagnosis not present

## 2020-08-20 DIAGNOSIS — Z23 Encounter for immunization: Secondary | ICD-10-CM | POA: Diagnosis not present

## 2020-09-01 DIAGNOSIS — Z20828 Contact with and (suspected) exposure to other viral communicable diseases: Secondary | ICD-10-CM | POA: Diagnosis not present

## 2020-09-01 DIAGNOSIS — J029 Acute pharyngitis, unspecified: Secondary | ICD-10-CM | POA: Diagnosis not present

## 2020-09-04 DIAGNOSIS — J441 Chronic obstructive pulmonary disease with (acute) exacerbation: Secondary | ICD-10-CM | POA: Diagnosis not present

## 2020-09-17 DIAGNOSIS — Z23 Encounter for immunization: Secondary | ICD-10-CM | POA: Diagnosis not present

## 2020-10-10 DIAGNOSIS — R922 Inconclusive mammogram: Secondary | ICD-10-CM | POA: Diagnosis not present

## 2020-10-10 DIAGNOSIS — Z170 Estrogen receptor positive status [ER+]: Secondary | ICD-10-CM | POA: Diagnosis not present

## 2020-10-10 DIAGNOSIS — C50311 Malignant neoplasm of lower-inner quadrant of right female breast: Secondary | ICD-10-CM | POA: Diagnosis not present

## 2020-10-23 DIAGNOSIS — C50311 Malignant neoplasm of lower-inner quadrant of right female breast: Secondary | ICD-10-CM | POA: Diagnosis not present

## 2020-10-23 DIAGNOSIS — Z170 Estrogen receptor positive status [ER+]: Secondary | ICD-10-CM | POA: Diagnosis not present

## 2020-10-23 DIAGNOSIS — J449 Chronic obstructive pulmonary disease, unspecified: Secondary | ICD-10-CM | POA: Diagnosis not present

## 2020-10-23 DIAGNOSIS — Z923 Personal history of irradiation: Secondary | ICD-10-CM | POA: Diagnosis not present

## 2020-11-21 DIAGNOSIS — G43919 Migraine, unspecified, intractable, without status migrainosus: Secondary | ICD-10-CM | POA: Diagnosis not present

## 2020-11-21 DIAGNOSIS — Z20822 Contact with and (suspected) exposure to covid-19: Secondary | ICD-10-CM | POA: Diagnosis not present

## 2020-11-21 DIAGNOSIS — M542 Cervicalgia: Secondary | ICD-10-CM | POA: Diagnosis not present

## 2020-11-21 DIAGNOSIS — J449 Chronic obstructive pulmonary disease, unspecified: Secondary | ICD-10-CM | POA: Diagnosis not present

## 2020-11-21 DIAGNOSIS — C50911 Malignant neoplasm of unspecified site of right female breast: Secondary | ICD-10-CM | POA: Diagnosis not present

## 2020-11-21 DIAGNOSIS — Z681 Body mass index (BMI) 19 or less, adult: Secondary | ICD-10-CM | POA: Diagnosis not present

## 2020-11-21 DIAGNOSIS — F329 Major depressive disorder, single episode, unspecified: Secondary | ICD-10-CM | POA: Diagnosis not present

## 2020-11-21 DIAGNOSIS — M545 Low back pain, unspecified: Secondary | ICD-10-CM | POA: Diagnosis not present

## 2020-11-21 DIAGNOSIS — Z720 Tobacco use: Secondary | ICD-10-CM | POA: Diagnosis not present

## 2020-11-21 DIAGNOSIS — R300 Dysuria: Secondary | ICD-10-CM | POA: Diagnosis not present

## 2020-11-21 DIAGNOSIS — Z20828 Contact with and (suspected) exposure to other viral communicable diseases: Secondary | ICD-10-CM | POA: Diagnosis not present

## 2020-11-21 DIAGNOSIS — F419 Anxiety disorder, unspecified: Secondary | ICD-10-CM | POA: Diagnosis not present

## 2020-12-18 DIAGNOSIS — Z20828 Contact with and (suspected) exposure to other viral communicable diseases: Secondary | ICD-10-CM | POA: Diagnosis not present

## 2020-12-18 DIAGNOSIS — Z681 Body mass index (BMI) 19 or less, adult: Secondary | ICD-10-CM | POA: Diagnosis not present

## 2020-12-18 DIAGNOSIS — J441 Chronic obstructive pulmonary disease with (acute) exacerbation: Secondary | ICD-10-CM | POA: Diagnosis not present

## 2021-01-28 DIAGNOSIS — R350 Frequency of micturition: Secondary | ICD-10-CM | POA: Diagnosis not present

## 2021-01-28 DIAGNOSIS — R300 Dysuria: Secondary | ICD-10-CM | POA: Diagnosis not present

## 2021-01-28 DIAGNOSIS — Z681 Body mass index (BMI) 19 or less, adult: Secondary | ICD-10-CM | POA: Diagnosis not present

## 2021-02-17 DIAGNOSIS — D230 Other benign neoplasm of skin of lip: Secondary | ICD-10-CM | POA: Diagnosis not present

## 2021-02-17 DIAGNOSIS — Z1283 Encounter for screening for malignant neoplasm of skin: Secondary | ICD-10-CM | POA: Diagnosis not present

## 2021-02-17 DIAGNOSIS — I781 Nevus, non-neoplastic: Secondary | ICD-10-CM | POA: Diagnosis not present

## 2021-02-17 DIAGNOSIS — L570 Actinic keratosis: Secondary | ICD-10-CM | POA: Diagnosis not present

## 2021-03-11 DIAGNOSIS — M545 Low back pain, unspecified: Secondary | ICD-10-CM | POA: Diagnosis not present

## 2021-03-11 DIAGNOSIS — C50911 Malignant neoplasm of unspecified site of right female breast: Secondary | ICD-10-CM | POA: Diagnosis not present

## 2021-03-11 DIAGNOSIS — Z20822 Contact with and (suspected) exposure to covid-19: Secondary | ICD-10-CM | POA: Diagnosis not present

## 2021-03-11 DIAGNOSIS — R079 Chest pain, unspecified: Secondary | ICD-10-CM | POA: Diagnosis not present

## 2021-03-11 DIAGNOSIS — Z720 Tobacco use: Secondary | ICD-10-CM | POA: Diagnosis not present

## 2021-03-11 DIAGNOSIS — F419 Anxiety disorder, unspecified: Secondary | ICD-10-CM | POA: Diagnosis not present

## 2021-03-11 DIAGNOSIS — G43919 Migraine, unspecified, intractable, without status migrainosus: Secondary | ICD-10-CM | POA: Diagnosis not present

## 2021-03-11 DIAGNOSIS — R300 Dysuria: Secondary | ICD-10-CM | POA: Diagnosis not present

## 2021-03-11 DIAGNOSIS — R002 Palpitations: Secondary | ICD-10-CM | POA: Diagnosis not present

## 2021-03-11 DIAGNOSIS — J449 Chronic obstructive pulmonary disease, unspecified: Secondary | ICD-10-CM | POA: Diagnosis not present

## 2021-03-11 DIAGNOSIS — F329 Major depressive disorder, single episode, unspecified: Secondary | ICD-10-CM | POA: Diagnosis not present

## 2021-03-11 DIAGNOSIS — M542 Cervicalgia: Secondary | ICD-10-CM | POA: Diagnosis not present

## 2021-03-11 DIAGNOSIS — Z20828 Contact with and (suspected) exposure to other viral communicable diseases: Secondary | ICD-10-CM | POA: Diagnosis not present

## 2021-03-17 DIAGNOSIS — Z23 Encounter for immunization: Secondary | ICD-10-CM | POA: Diagnosis not present

## 2021-04-01 DIAGNOSIS — R300 Dysuria: Secondary | ICD-10-CM | POA: Diagnosis not present

## 2021-04-01 DIAGNOSIS — Z681 Body mass index (BMI) 19 or less, adult: Secondary | ICD-10-CM | POA: Diagnosis not present

## 2021-04-01 DIAGNOSIS — J029 Acute pharyngitis, unspecified: Secondary | ICD-10-CM | POA: Diagnosis not present

## 2021-04-16 DIAGNOSIS — C50311 Malignant neoplasm of lower-inner quadrant of right female breast: Secondary | ICD-10-CM | POA: Diagnosis not present

## 2021-04-16 DIAGNOSIS — Z170 Estrogen receptor positive status [ER+]: Secondary | ICD-10-CM | POA: Diagnosis not present

## 2021-04-16 DIAGNOSIS — Z808 Family history of malignant neoplasm of other organs or systems: Secondary | ICD-10-CM | POA: Diagnosis not present

## 2021-04-16 DIAGNOSIS — R922 Inconclusive mammogram: Secondary | ICD-10-CM | POA: Diagnosis not present

## 2021-04-16 DIAGNOSIS — J439 Emphysema, unspecified: Secondary | ICD-10-CM | POA: Diagnosis not present

## 2021-04-16 DIAGNOSIS — R921 Mammographic calcification found on diagnostic imaging of breast: Secondary | ICD-10-CM | POA: Diagnosis not present

## 2021-04-16 DIAGNOSIS — N6324 Unspecified lump in the left breast, lower inner quadrant: Secondary | ICD-10-CM | POA: Diagnosis not present

## 2021-04-23 DIAGNOSIS — C50311 Malignant neoplasm of lower-inner quadrant of right female breast: Secondary | ICD-10-CM | POA: Diagnosis not present

## 2021-04-23 DIAGNOSIS — Z08 Encounter for follow-up examination after completed treatment for malignant neoplasm: Secondary | ICD-10-CM | POA: Diagnosis not present

## 2021-04-23 DIAGNOSIS — Z853 Personal history of malignant neoplasm of breast: Secondary | ICD-10-CM | POA: Diagnosis not present

## 2021-04-23 DIAGNOSIS — Z170 Estrogen receptor positive status [ER+]: Secondary | ICD-10-CM | POA: Diagnosis not present

## 2021-04-23 DIAGNOSIS — Z923 Personal history of irradiation: Secondary | ICD-10-CM | POA: Diagnosis not present

## 2021-05-26 DIAGNOSIS — H9201 Otalgia, right ear: Secondary | ICD-10-CM | POA: Diagnosis not present

## 2021-05-26 DIAGNOSIS — R0781 Pleurodynia: Secondary | ICD-10-CM | POA: Diagnosis not present

## 2021-05-26 DIAGNOSIS — Z20828 Contact with and (suspected) exposure to other viral communicable diseases: Secondary | ICD-10-CM | POA: Diagnosis not present

## 2021-06-30 DIAGNOSIS — Z01419 Encounter for gynecological examination (general) (routine) without abnormal findings: Secondary | ICD-10-CM | POA: Diagnosis not present

## 2021-07-14 DIAGNOSIS — J449 Chronic obstructive pulmonary disease, unspecified: Secondary | ICD-10-CM | POA: Diagnosis not present

## 2021-07-14 DIAGNOSIS — G43919 Migraine, unspecified, intractable, without status migrainosus: Secondary | ICD-10-CM | POA: Diagnosis not present

## 2021-07-14 DIAGNOSIS — F329 Major depressive disorder, single episode, unspecified: Secondary | ICD-10-CM | POA: Diagnosis not present

## 2021-07-14 DIAGNOSIS — R300 Dysuria: Secondary | ICD-10-CM | POA: Diagnosis not present

## 2021-07-14 DIAGNOSIS — F419 Anxiety disorder, unspecified: Secondary | ICD-10-CM | POA: Diagnosis not present

## 2021-07-14 DIAGNOSIS — Z720 Tobacco use: Secondary | ICD-10-CM | POA: Diagnosis not present

## 2021-07-14 DIAGNOSIS — Z681 Body mass index (BMI) 19 or less, adult: Secondary | ICD-10-CM | POA: Diagnosis not present

## 2021-07-14 DIAGNOSIS — M542 Cervicalgia: Secondary | ICD-10-CM | POA: Diagnosis not present

## 2021-07-14 DIAGNOSIS — C50911 Malignant neoplasm of unspecified site of right female breast: Secondary | ICD-10-CM | POA: Diagnosis not present

## 2021-07-14 DIAGNOSIS — Z20828 Contact with and (suspected) exposure to other viral communicable diseases: Secondary | ICD-10-CM | POA: Diagnosis not present

## 2021-07-14 DIAGNOSIS — M545 Low back pain, unspecified: Secondary | ICD-10-CM | POA: Diagnosis not present

## 2021-07-14 DIAGNOSIS — Z20822 Contact with and (suspected) exposure to covid-19: Secondary | ICD-10-CM | POA: Diagnosis not present

## 2021-08-31 DIAGNOSIS — J449 Chronic obstructive pulmonary disease, unspecified: Secondary | ICD-10-CM | POA: Diagnosis not present

## 2021-08-31 DIAGNOSIS — Z0001 Encounter for general adult medical examination with abnormal findings: Secondary | ICD-10-CM | POA: Diagnosis not present

## 2021-08-31 DIAGNOSIS — F419 Anxiety disorder, unspecified: Secondary | ICD-10-CM | POA: Diagnosis not present

## 2021-08-31 DIAGNOSIS — F32A Depression, unspecified: Secondary | ICD-10-CM | POA: Diagnosis not present

## 2021-08-31 DIAGNOSIS — R002 Palpitations: Secondary | ICD-10-CM | POA: Diagnosis not present

## 2021-08-31 DIAGNOSIS — Z720 Tobacco use: Secondary | ICD-10-CM | POA: Diagnosis not present

## 2021-08-31 DIAGNOSIS — D649 Anemia, unspecified: Secondary | ICD-10-CM | POA: Diagnosis not present

## 2021-08-31 DIAGNOSIS — C50919 Malignant neoplasm of unspecified site of unspecified female breast: Secondary | ICD-10-CM | POA: Diagnosis not present

## 2021-09-04 DIAGNOSIS — J439 Emphysema, unspecified: Secondary | ICD-10-CM | POA: Diagnosis not present

## 2021-09-04 DIAGNOSIS — I700 Atherosclerosis of aorta: Secondary | ICD-10-CM | POA: Diagnosis not present

## 2021-09-04 DIAGNOSIS — J449 Chronic obstructive pulmonary disease, unspecified: Secondary | ICD-10-CM | POA: Diagnosis not present

## 2021-09-04 DIAGNOSIS — R911 Solitary pulmonary nodule: Secondary | ICD-10-CM | POA: Diagnosis not present

## 2021-09-14 DIAGNOSIS — J441 Chronic obstructive pulmonary disease with (acute) exacerbation: Secondary | ICD-10-CM | POA: Diagnosis not present

## 2021-09-14 DIAGNOSIS — Z20828 Contact with and (suspected) exposure to other viral communicable diseases: Secondary | ICD-10-CM | POA: Diagnosis not present

## 2021-09-14 DIAGNOSIS — J101 Influenza due to other identified influenza virus with other respiratory manifestations: Secondary | ICD-10-CM | POA: Diagnosis not present

## 2021-10-13 DIAGNOSIS — C50311 Malignant neoplasm of lower-inner quadrant of right female breast: Secondary | ICD-10-CM | POA: Diagnosis not present

## 2021-10-13 DIAGNOSIS — Z853 Personal history of malignant neoplasm of breast: Secondary | ICD-10-CM | POA: Diagnosis not present

## 2021-10-13 DIAGNOSIS — Z808 Family history of malignant neoplasm of other organs or systems: Secondary | ICD-10-CM | POA: Diagnosis not present

## 2021-10-13 DIAGNOSIS — Z170 Estrogen receptor positive status [ER+]: Secondary | ICD-10-CM | POA: Diagnosis not present

## 2021-10-28 DIAGNOSIS — C50311 Malignant neoplasm of lower-inner quadrant of right female breast: Secondary | ICD-10-CM | POA: Diagnosis not present

## 2021-10-28 DIAGNOSIS — Z170 Estrogen receptor positive status [ER+]: Secondary | ICD-10-CM | POA: Diagnosis not present

## 2021-10-28 DIAGNOSIS — Z9010 Acquired absence of unspecified breast and nipple: Secondary | ICD-10-CM | POA: Diagnosis not present

## 2021-11-13 DIAGNOSIS — G43919 Migraine, unspecified, intractable, without status migrainosus: Secondary | ICD-10-CM | POA: Diagnosis not present

## 2021-11-13 DIAGNOSIS — J029 Acute pharyngitis, unspecified: Secondary | ICD-10-CM | POA: Diagnosis not present

## 2021-11-13 DIAGNOSIS — F419 Anxiety disorder, unspecified: Secondary | ICD-10-CM | POA: Diagnosis not present

## 2021-11-13 DIAGNOSIS — J441 Chronic obstructive pulmonary disease with (acute) exacerbation: Secondary | ICD-10-CM | POA: Diagnosis not present

## 2021-11-13 DIAGNOSIS — Z20828 Contact with and (suspected) exposure to other viral communicable diseases: Secondary | ICD-10-CM | POA: Diagnosis not present

## 2021-11-13 DIAGNOSIS — R300 Dysuria: Secondary | ICD-10-CM | POA: Diagnosis not present

## 2021-11-13 DIAGNOSIS — F329 Major depressive disorder, single episode, unspecified: Secondary | ICD-10-CM | POA: Diagnosis not present

## 2021-11-13 DIAGNOSIS — Z720 Tobacco use: Secondary | ICD-10-CM | POA: Diagnosis not present

## 2021-11-30 DIAGNOSIS — Z20828 Contact with and (suspected) exposure to other viral communicable diseases: Secondary | ICD-10-CM | POA: Diagnosis not present

## 2021-11-30 DIAGNOSIS — Z720 Tobacco use: Secondary | ICD-10-CM | POA: Diagnosis not present

## 2021-11-30 DIAGNOSIS — Z20822 Contact with and (suspected) exposure to covid-19: Secondary | ICD-10-CM | POA: Diagnosis not present

## 2021-11-30 DIAGNOSIS — R300 Dysuria: Secondary | ICD-10-CM | POA: Diagnosis not present

## 2021-11-30 DIAGNOSIS — J449 Chronic obstructive pulmonary disease, unspecified: Secondary | ICD-10-CM | POA: Diagnosis not present

## 2021-11-30 DIAGNOSIS — Z681 Body mass index (BMI) 19 or less, adult: Secondary | ICD-10-CM | POA: Diagnosis not present

## 2021-12-28 DIAGNOSIS — J449 Chronic obstructive pulmonary disease, unspecified: Secondary | ICD-10-CM | POA: Diagnosis not present

## 2021-12-28 DIAGNOSIS — Z681 Body mass index (BMI) 19 or less, adult: Secondary | ICD-10-CM | POA: Diagnosis not present

## 2021-12-28 DIAGNOSIS — Z720 Tobacco use: Secondary | ICD-10-CM | POA: Diagnosis not present

## 2021-12-28 DIAGNOSIS — Z20828 Contact with and (suspected) exposure to other viral communicable diseases: Secondary | ICD-10-CM | POA: Diagnosis not present

## 2022-01-18 DIAGNOSIS — Z20828 Contact with and (suspected) exposure to other viral communicable diseases: Secondary | ICD-10-CM | POA: Diagnosis not present

## 2022-01-25 DIAGNOSIS — Z041 Encounter for examination and observation following transport accident: Secondary | ICD-10-CM | POA: Diagnosis not present

## 2022-01-25 DIAGNOSIS — S161XXA Strain of muscle, fascia and tendon at neck level, initial encounter: Secondary | ICD-10-CM | POA: Diagnosis not present

## 2022-01-25 DIAGNOSIS — R519 Headache, unspecified: Secondary | ICD-10-CM | POA: Diagnosis not present

## 2022-01-25 DIAGNOSIS — R000 Tachycardia, unspecified: Secondary | ICD-10-CM | POA: Diagnosis not present

## 2022-01-25 DIAGNOSIS — R0781 Pleurodynia: Secondary | ICD-10-CM | POA: Diagnosis not present

## 2022-01-25 DIAGNOSIS — M542 Cervicalgia: Secondary | ICD-10-CM | POA: Diagnosis not present

## 2022-01-25 DIAGNOSIS — J439 Emphysema, unspecified: Secondary | ICD-10-CM | POA: Diagnosis not present

## 2022-01-25 DIAGNOSIS — R52 Pain, unspecified: Secondary | ICD-10-CM | POA: Diagnosis not present

## 2022-01-25 DIAGNOSIS — R0689 Other abnormalities of breathing: Secondary | ICD-10-CM | POA: Diagnosis not present

## 2022-01-25 DIAGNOSIS — M545 Low back pain, unspecified: Secondary | ICD-10-CM | POA: Diagnosis not present

## 2022-01-25 DIAGNOSIS — M25561 Pain in right knee: Secondary | ICD-10-CM | POA: Diagnosis not present

## 2022-01-25 DIAGNOSIS — S0990XA Unspecified injury of head, initial encounter: Secondary | ICD-10-CM | POA: Diagnosis not present

## 2022-02-01 DIAGNOSIS — Z20822 Contact with and (suspected) exposure to covid-19: Secondary | ICD-10-CM | POA: Diagnosis not present

## 2022-02-06 DIAGNOSIS — Z20828 Contact with and (suspected) exposure to other viral communicable diseases: Secondary | ICD-10-CM | POA: Diagnosis not present

## 2022-02-18 DIAGNOSIS — Z20822 Contact with and (suspected) exposure to covid-19: Secondary | ICD-10-CM | POA: Diagnosis not present

## 2022-02-24 DIAGNOSIS — D485 Neoplasm of uncertain behavior of skin: Secondary | ICD-10-CM | POA: Diagnosis not present

## 2022-02-24 DIAGNOSIS — L570 Actinic keratosis: Secondary | ICD-10-CM | POA: Diagnosis not present

## 2022-02-24 DIAGNOSIS — B079 Viral wart, unspecified: Secondary | ICD-10-CM | POA: Diagnosis not present

## 2022-03-01 DIAGNOSIS — R300 Dysuria: Secondary | ICD-10-CM | POA: Diagnosis not present

## 2022-03-01 DIAGNOSIS — Z20822 Contact with and (suspected) exposure to covid-19: Secondary | ICD-10-CM | POA: Diagnosis not present

## 2022-03-01 DIAGNOSIS — Z20828 Contact with and (suspected) exposure to other viral communicable diseases: Secondary | ICD-10-CM | POA: Diagnosis not present

## 2022-03-01 DIAGNOSIS — F419 Anxiety disorder, unspecified: Secondary | ICD-10-CM | POA: Diagnosis not present

## 2022-03-01 DIAGNOSIS — Z720 Tobacco use: Secondary | ICD-10-CM | POA: Diagnosis not present

## 2022-03-01 DIAGNOSIS — M542 Cervicalgia: Secondary | ICD-10-CM | POA: Diagnosis not present

## 2022-03-01 DIAGNOSIS — M545 Low back pain, unspecified: Secondary | ICD-10-CM | POA: Diagnosis not present

## 2022-03-01 DIAGNOSIS — J441 Chronic obstructive pulmonary disease with (acute) exacerbation: Secondary | ICD-10-CM | POA: Diagnosis not present

## 2022-03-01 DIAGNOSIS — F329 Major depressive disorder, single episode, unspecified: Secondary | ICD-10-CM | POA: Diagnosis not present

## 2022-03-01 DIAGNOSIS — G43919 Migraine, unspecified, intractable, without status migrainosus: Secondary | ICD-10-CM | POA: Diagnosis not present

## 2022-03-01 DIAGNOSIS — J029 Acute pharyngitis, unspecified: Secondary | ICD-10-CM | POA: Diagnosis not present

## 2022-03-01 DIAGNOSIS — Z681 Body mass index (BMI) 19 or less, adult: Secondary | ICD-10-CM | POA: Diagnosis not present

## 2022-03-08 DIAGNOSIS — Z20822 Contact with and (suspected) exposure to covid-19: Secondary | ICD-10-CM | POA: Diagnosis not present

## 2022-03-11 DIAGNOSIS — Z20822 Contact with and (suspected) exposure to covid-19: Secondary | ICD-10-CM | POA: Diagnosis not present

## 2022-03-23 DIAGNOSIS — R300 Dysuria: Secondary | ICD-10-CM | POA: Diagnosis not present

## 2022-03-23 DIAGNOSIS — J441 Chronic obstructive pulmonary disease with (acute) exacerbation: Secondary | ICD-10-CM | POA: Diagnosis not present

## 2022-03-23 DIAGNOSIS — Z681 Body mass index (BMI) 19 or less, adult: Secondary | ICD-10-CM | POA: Diagnosis not present

## 2022-03-23 DIAGNOSIS — R0781 Pleurodynia: Secondary | ICD-10-CM | POA: Diagnosis not present

## 2022-03-23 DIAGNOSIS — I10 Essential (primary) hypertension: Secondary | ICD-10-CM | POA: Diagnosis not present

## 2022-03-24 DIAGNOSIS — R0781 Pleurodynia: Secondary | ICD-10-CM | POA: Diagnosis not present

## 2022-04-28 DIAGNOSIS — Z170 Estrogen receptor positive status [ER+]: Secondary | ICD-10-CM | POA: Diagnosis not present

## 2022-04-28 DIAGNOSIS — C50311 Malignant neoplasm of lower-inner quadrant of right female breast: Secondary | ICD-10-CM | POA: Diagnosis not present

## 2022-06-08 DIAGNOSIS — E039 Hypothyroidism, unspecified: Secondary | ICD-10-CM | POA: Diagnosis not present

## 2022-06-08 DIAGNOSIS — F329 Major depressive disorder, single episode, unspecified: Secondary | ICD-10-CM | POA: Diagnosis not present

## 2022-06-08 DIAGNOSIS — G43919 Migraine, unspecified, intractable, without status migrainosus: Secondary | ICD-10-CM | POA: Diagnosis not present

## 2022-06-08 DIAGNOSIS — R5383 Other fatigue: Secondary | ICD-10-CM | POA: Diagnosis not present

## 2022-06-08 DIAGNOSIS — R002 Palpitations: Secondary | ICD-10-CM | POA: Diagnosis not present

## 2022-06-08 DIAGNOSIS — Z853 Personal history of malignant neoplasm of breast: Secondary | ICD-10-CM | POA: Diagnosis not present

## 2022-06-08 DIAGNOSIS — E559 Vitamin D deficiency, unspecified: Secondary | ICD-10-CM | POA: Diagnosis not present

## 2022-06-08 DIAGNOSIS — E782 Mixed hyperlipidemia: Secondary | ICD-10-CM | POA: Diagnosis not present

## 2022-06-08 DIAGNOSIS — Z20822 Contact with and (suspected) exposure to covid-19: Secondary | ICD-10-CM | POA: Diagnosis not present

## 2022-06-08 DIAGNOSIS — M542 Cervicalgia: Secondary | ICD-10-CM | POA: Diagnosis not present

## 2022-06-08 DIAGNOSIS — M545 Low back pain, unspecified: Secondary | ICD-10-CM | POA: Diagnosis not present

## 2022-06-08 DIAGNOSIS — R300 Dysuria: Secondary | ICD-10-CM | POA: Diagnosis not present

## 2022-06-08 DIAGNOSIS — Z720 Tobacco use: Secondary | ICD-10-CM | POA: Diagnosis not present

## 2022-06-08 DIAGNOSIS — Z681 Body mass index (BMI) 19 or less, adult: Secondary | ICD-10-CM | POA: Diagnosis not present

## 2022-06-08 DIAGNOSIS — Z20828 Contact with and (suspected) exposure to other viral communicable diseases: Secondary | ICD-10-CM | POA: Diagnosis not present

## 2022-06-08 DIAGNOSIS — D72829 Elevated white blood cell count, unspecified: Secondary | ICD-10-CM | POA: Diagnosis not present

## 2022-06-08 DIAGNOSIS — J441 Chronic obstructive pulmonary disease with (acute) exacerbation: Secondary | ICD-10-CM | POA: Diagnosis not present

## 2022-06-08 DIAGNOSIS — F419 Anxiety disorder, unspecified: Secondary | ICD-10-CM | POA: Diagnosis not present

## 2022-06-08 DIAGNOSIS — R911 Solitary pulmonary nodule: Secondary | ICD-10-CM | POA: Diagnosis not present

## 2022-08-02 DIAGNOSIS — R300 Dysuria: Secondary | ICD-10-CM | POA: Diagnosis not present

## 2022-08-02 DIAGNOSIS — G43919 Migraine, unspecified, intractable, without status migrainosus: Secondary | ICD-10-CM | POA: Diagnosis not present

## 2022-08-02 DIAGNOSIS — R911 Solitary pulmonary nodule: Secondary | ICD-10-CM | POA: Diagnosis not present

## 2022-08-02 DIAGNOSIS — Z20822 Contact with and (suspected) exposure to covid-19: Secondary | ICD-10-CM | POA: Diagnosis not present

## 2022-08-02 DIAGNOSIS — M542 Cervicalgia: Secondary | ICD-10-CM | POA: Diagnosis not present

## 2022-08-02 DIAGNOSIS — Z681 Body mass index (BMI) 19 or less, adult: Secondary | ICD-10-CM | POA: Diagnosis not present

## 2022-08-02 DIAGNOSIS — F329 Major depressive disorder, single episode, unspecified: Secondary | ICD-10-CM | POA: Diagnosis not present

## 2022-08-02 DIAGNOSIS — R030 Elevated blood-pressure reading, without diagnosis of hypertension: Secondary | ICD-10-CM | POA: Diagnosis not present

## 2022-08-02 DIAGNOSIS — J441 Chronic obstructive pulmonary disease with (acute) exacerbation: Secondary | ICD-10-CM | POA: Diagnosis not present

## 2022-08-02 DIAGNOSIS — Z20828 Contact with and (suspected) exposure to other viral communicable diseases: Secondary | ICD-10-CM | POA: Diagnosis not present

## 2022-08-02 DIAGNOSIS — M545 Low back pain, unspecified: Secondary | ICD-10-CM | POA: Diagnosis not present

## 2022-08-02 DIAGNOSIS — Z853 Personal history of malignant neoplasm of breast: Secondary | ICD-10-CM | POA: Diagnosis not present

## 2022-08-02 DIAGNOSIS — Z720 Tobacco use: Secondary | ICD-10-CM | POA: Diagnosis not present

## 2022-08-12 DIAGNOSIS — R911 Solitary pulmonary nodule: Secondary | ICD-10-CM | POA: Diagnosis not present

## 2022-08-12 DIAGNOSIS — J439 Emphysema, unspecified: Secondary | ICD-10-CM | POA: Diagnosis not present

## 2022-10-05 DIAGNOSIS — Z853 Personal history of malignant neoplasm of breast: Secondary | ICD-10-CM | POA: Diagnosis not present

## 2022-10-05 DIAGNOSIS — Z20822 Contact with and (suspected) exposure to covid-19: Secondary | ICD-10-CM | POA: Diagnosis not present

## 2022-10-05 DIAGNOSIS — R030 Elevated blood-pressure reading, without diagnosis of hypertension: Secondary | ICD-10-CM | POA: Diagnosis not present

## 2022-10-05 DIAGNOSIS — Z720 Tobacco use: Secondary | ICD-10-CM | POA: Diagnosis not present

## 2022-10-05 DIAGNOSIS — M542 Cervicalgia: Secondary | ICD-10-CM | POA: Diagnosis not present

## 2022-10-05 DIAGNOSIS — R300 Dysuria: Secondary | ICD-10-CM | POA: Diagnosis not present

## 2022-10-05 DIAGNOSIS — M25512 Pain in left shoulder: Secondary | ICD-10-CM | POA: Diagnosis not present

## 2022-10-05 DIAGNOSIS — E559 Vitamin D deficiency, unspecified: Secondary | ICD-10-CM | POA: Diagnosis not present

## 2022-10-05 DIAGNOSIS — G43919 Migraine, unspecified, intractable, without status migrainosus: Secondary | ICD-10-CM | POA: Diagnosis not present

## 2022-10-05 DIAGNOSIS — J441 Chronic obstructive pulmonary disease with (acute) exacerbation: Secondary | ICD-10-CM | POA: Diagnosis not present

## 2022-10-05 DIAGNOSIS — Z0001 Encounter for general adult medical examination with abnormal findings: Secondary | ICD-10-CM | POA: Diagnosis not present

## 2022-10-05 DIAGNOSIS — R911 Solitary pulmonary nodule: Secondary | ICD-10-CM | POA: Diagnosis not present

## 2022-10-13 DIAGNOSIS — C50311 Malignant neoplasm of lower-inner quadrant of right female breast: Secondary | ICD-10-CM | POA: Diagnosis not present

## 2022-10-13 DIAGNOSIS — R92343 Mammographic extreme density, bilateral breasts: Secondary | ICD-10-CM | POA: Diagnosis not present

## 2022-10-13 DIAGNOSIS — Z808 Family history of malignant neoplasm of other organs or systems: Secondary | ICD-10-CM | POA: Diagnosis not present

## 2022-10-13 DIAGNOSIS — Z170 Estrogen receptor positive status [ER+]: Secondary | ICD-10-CM | POA: Diagnosis not present

## 2022-10-13 DIAGNOSIS — F17210 Nicotine dependence, cigarettes, uncomplicated: Secondary | ICD-10-CM | POA: Diagnosis not present

## 2022-10-13 DIAGNOSIS — Z880 Allergy status to penicillin: Secondary | ICD-10-CM | POA: Diagnosis not present

## 2022-12-01 DIAGNOSIS — Z681 Body mass index (BMI) 19 or less, adult: Secondary | ICD-10-CM | POA: Diagnosis not present

## 2022-12-01 DIAGNOSIS — F419 Anxiety disorder, unspecified: Secondary | ICD-10-CM | POA: Diagnosis not present

## 2022-12-01 DIAGNOSIS — R300 Dysuria: Secondary | ICD-10-CM | POA: Diagnosis not present

## 2022-12-01 DIAGNOSIS — G43919 Migraine, unspecified, intractable, without status migrainosus: Secondary | ICD-10-CM | POA: Diagnosis not present

## 2022-12-01 DIAGNOSIS — M542 Cervicalgia: Secondary | ICD-10-CM | POA: Diagnosis not present

## 2022-12-01 DIAGNOSIS — J441 Chronic obstructive pulmonary disease with (acute) exacerbation: Secondary | ICD-10-CM | POA: Diagnosis not present

## 2022-12-01 DIAGNOSIS — Z853 Personal history of malignant neoplasm of breast: Secondary | ICD-10-CM | POA: Diagnosis not present

## 2022-12-01 DIAGNOSIS — R911 Solitary pulmonary nodule: Secondary | ICD-10-CM | POA: Diagnosis not present

## 2022-12-01 DIAGNOSIS — E559 Vitamin D deficiency, unspecified: Secondary | ICD-10-CM | POA: Diagnosis not present

## 2022-12-01 DIAGNOSIS — R030 Elevated blood-pressure reading, without diagnosis of hypertension: Secondary | ICD-10-CM | POA: Diagnosis not present

## 2022-12-01 DIAGNOSIS — Z20828 Contact with and (suspected) exposure to other viral communicable diseases: Secondary | ICD-10-CM | POA: Diagnosis not present

## 2022-12-01 DIAGNOSIS — Z20822 Contact with and (suspected) exposure to covid-19: Secondary | ICD-10-CM | POA: Diagnosis not present

## 2022-12-01 DIAGNOSIS — F329 Major depressive disorder, single episode, unspecified: Secondary | ICD-10-CM | POA: Diagnosis not present

## 2022-12-01 DIAGNOSIS — M545 Low back pain, unspecified: Secondary | ICD-10-CM | POA: Diagnosis not present

## 2022-12-01 DIAGNOSIS — Z720 Tobacco use: Secondary | ICD-10-CM | POA: Diagnosis not present

## 2023-01-12 DIAGNOSIS — G43919 Migraine, unspecified, intractable, without status migrainosus: Secondary | ICD-10-CM | POA: Diagnosis not present

## 2023-01-12 DIAGNOSIS — M545 Low back pain, unspecified: Secondary | ICD-10-CM | POA: Diagnosis not present

## 2023-01-12 DIAGNOSIS — E559 Vitamin D deficiency, unspecified: Secondary | ICD-10-CM | POA: Diagnosis not present

## 2023-01-12 DIAGNOSIS — F329 Major depressive disorder, single episode, unspecified: Secondary | ICD-10-CM | POA: Diagnosis not present

## 2023-01-12 DIAGNOSIS — M542 Cervicalgia: Secondary | ICD-10-CM | POA: Diagnosis not present

## 2023-01-12 DIAGNOSIS — R300 Dysuria: Secondary | ICD-10-CM | POA: Diagnosis not present

## 2023-01-12 DIAGNOSIS — R030 Elevated blood-pressure reading, without diagnosis of hypertension: Secondary | ICD-10-CM | POA: Diagnosis not present

## 2023-01-12 DIAGNOSIS — F419 Anxiety disorder, unspecified: Secondary | ICD-10-CM | POA: Diagnosis not present

## 2023-01-12 DIAGNOSIS — R911 Solitary pulmonary nodule: Secondary | ICD-10-CM | POA: Diagnosis not present

## 2023-01-12 DIAGNOSIS — Z20822 Contact with and (suspected) exposure to covid-19: Secondary | ICD-10-CM | POA: Diagnosis not present

## 2023-01-12 DIAGNOSIS — Z720 Tobacco use: Secondary | ICD-10-CM | POA: Diagnosis not present

## 2023-01-12 DIAGNOSIS — J441 Chronic obstructive pulmonary disease with (acute) exacerbation: Secondary | ICD-10-CM | POA: Diagnosis not present

## 2023-01-12 DIAGNOSIS — Z853 Personal history of malignant neoplasm of breast: Secondary | ICD-10-CM | POA: Diagnosis not present

## 2023-02-19 DIAGNOSIS — J441 Chronic obstructive pulmonary disease with (acute) exacerbation: Secondary | ICD-10-CM | POA: Diagnosis not present

## 2023-02-19 DIAGNOSIS — Z880 Allergy status to penicillin: Secondary | ICD-10-CM | POA: Diagnosis not present

## 2023-02-19 DIAGNOSIS — R0781 Pleurodynia: Secondary | ICD-10-CM | POA: Diagnosis not present

## 2023-02-19 DIAGNOSIS — Z882 Allergy status to sulfonamides status: Secondary | ICD-10-CM | POA: Diagnosis not present

## 2023-02-19 DIAGNOSIS — Z886 Allergy status to analgesic agent status: Secondary | ICD-10-CM | POA: Diagnosis not present

## 2023-02-19 DIAGNOSIS — S20212A Contusion of left front wall of thorax, initial encounter: Secondary | ICD-10-CM | POA: Diagnosis not present

## 2023-02-19 DIAGNOSIS — J439 Emphysema, unspecified: Secondary | ICD-10-CM | POA: Diagnosis not present

## 2023-02-19 DIAGNOSIS — Z720 Tobacco use: Secondary | ICD-10-CM | POA: Diagnosis not present

## 2023-02-19 DIAGNOSIS — F17210 Nicotine dependence, cigarettes, uncomplicated: Secondary | ICD-10-CM | POA: Diagnosis not present

## 2023-02-19 DIAGNOSIS — Z79899 Other long term (current) drug therapy: Secondary | ICD-10-CM | POA: Diagnosis not present

## 2023-02-19 DIAGNOSIS — W1839XA Other fall on same level, initial encounter: Secondary | ICD-10-CM | POA: Diagnosis not present

## 2023-02-21 DIAGNOSIS — E559 Vitamin D deficiency, unspecified: Secondary | ICD-10-CM | POA: Diagnosis not present

## 2023-02-21 DIAGNOSIS — M545 Low back pain, unspecified: Secondary | ICD-10-CM | POA: Diagnosis not present

## 2023-02-21 DIAGNOSIS — F329 Major depressive disorder, single episode, unspecified: Secondary | ICD-10-CM | POA: Diagnosis not present

## 2023-02-21 DIAGNOSIS — R300 Dysuria: Secondary | ICD-10-CM | POA: Diagnosis not present

## 2023-02-21 DIAGNOSIS — M542 Cervicalgia: Secondary | ICD-10-CM | POA: Diagnosis not present

## 2023-02-21 DIAGNOSIS — Z1283 Encounter for screening for malignant neoplasm of skin: Secondary | ICD-10-CM | POA: Diagnosis not present

## 2023-02-21 DIAGNOSIS — L570 Actinic keratosis: Secondary | ICD-10-CM | POA: Diagnosis not present

## 2023-02-21 DIAGNOSIS — R030 Elevated blood-pressure reading, without diagnosis of hypertension: Secondary | ICD-10-CM | POA: Diagnosis not present

## 2023-02-21 DIAGNOSIS — G43919 Migraine, unspecified, intractable, without status migrainosus: Secondary | ICD-10-CM | POA: Diagnosis not present

## 2023-02-21 DIAGNOSIS — F419 Anxiety disorder, unspecified: Secondary | ICD-10-CM | POA: Diagnosis not present

## 2023-02-21 DIAGNOSIS — Z20828 Contact with and (suspected) exposure to other viral communicable diseases: Secondary | ICD-10-CM | POA: Diagnosis not present

## 2023-02-21 DIAGNOSIS — D239 Other benign neoplasm of skin, unspecified: Secondary | ICD-10-CM | POA: Diagnosis not present

## 2023-02-21 DIAGNOSIS — J441 Chronic obstructive pulmonary disease with (acute) exacerbation: Secondary | ICD-10-CM | POA: Diagnosis not present

## 2023-02-21 DIAGNOSIS — Z720 Tobacco use: Secondary | ICD-10-CM | POA: Diagnosis not present

## 2023-02-21 DIAGNOSIS — Z853 Personal history of malignant neoplasm of breast: Secondary | ICD-10-CM | POA: Diagnosis not present

## 2023-02-21 DIAGNOSIS — R911 Solitary pulmonary nodule: Secondary | ICD-10-CM | POA: Diagnosis not present

## 2023-02-21 DIAGNOSIS — Z20822 Contact with and (suspected) exposure to covid-19: Secondary | ICD-10-CM | POA: Diagnosis not present

## 2023-04-14 DIAGNOSIS — M542 Cervicalgia: Secondary | ICD-10-CM | POA: Diagnosis not present

## 2023-04-14 DIAGNOSIS — R300 Dysuria: Secondary | ICD-10-CM | POA: Diagnosis not present

## 2023-04-14 DIAGNOSIS — Z720 Tobacco use: Secondary | ICD-10-CM | POA: Diagnosis not present

## 2023-04-14 DIAGNOSIS — Z20822 Contact with and (suspected) exposure to covid-19: Secondary | ICD-10-CM | POA: Diagnosis not present

## 2023-04-14 DIAGNOSIS — G43919 Migraine, unspecified, intractable, without status migrainosus: Secondary | ICD-10-CM | POA: Diagnosis not present

## 2023-04-14 DIAGNOSIS — M545 Low back pain, unspecified: Secondary | ICD-10-CM | POA: Diagnosis not present

## 2023-04-14 DIAGNOSIS — R911 Solitary pulmonary nodule: Secondary | ICD-10-CM | POA: Diagnosis not present

## 2023-04-14 DIAGNOSIS — F419 Anxiety disorder, unspecified: Secondary | ICD-10-CM | POA: Diagnosis not present

## 2023-04-14 DIAGNOSIS — E559 Vitamin D deficiency, unspecified: Secondary | ICD-10-CM | POA: Diagnosis not present

## 2023-04-14 DIAGNOSIS — R030 Elevated blood-pressure reading, without diagnosis of hypertension: Secondary | ICD-10-CM | POA: Diagnosis not present

## 2023-04-14 DIAGNOSIS — J441 Chronic obstructive pulmonary disease with (acute) exacerbation: Secondary | ICD-10-CM | POA: Diagnosis not present

## 2023-04-14 DIAGNOSIS — Z20828 Contact with and (suspected) exposure to other viral communicable diseases: Secondary | ICD-10-CM | POA: Diagnosis not present

## 2023-04-14 DIAGNOSIS — Z853 Personal history of malignant neoplasm of breast: Secondary | ICD-10-CM | POA: Diagnosis not present

## 2023-04-14 DIAGNOSIS — F329 Major depressive disorder, single episode, unspecified: Secondary | ICD-10-CM | POA: Diagnosis not present

## 2023-04-26 DIAGNOSIS — C50311 Malignant neoplasm of lower-inner quadrant of right female breast: Secondary | ICD-10-CM | POA: Diagnosis not present

## 2023-04-26 DIAGNOSIS — Z170 Estrogen receptor positive status [ER+]: Secondary | ICD-10-CM | POA: Diagnosis not present

## 2023-05-17 DIAGNOSIS — F339 Major depressive disorder, recurrent, unspecified: Secondary | ICD-10-CM | POA: Diagnosis not present

## 2023-05-17 DIAGNOSIS — J029 Acute pharyngitis, unspecified: Secondary | ICD-10-CM | POA: Diagnosis not present

## 2023-05-17 DIAGNOSIS — J441 Chronic obstructive pulmonary disease with (acute) exacerbation: Secondary | ICD-10-CM | POA: Diagnosis not present

## 2023-05-17 DIAGNOSIS — Z20822 Contact with and (suspected) exposure to covid-19: Secondary | ICD-10-CM | POA: Diagnosis not present

## 2023-05-17 DIAGNOSIS — Z681 Body mass index (BMI) 19 or less, adult: Secondary | ICD-10-CM | POA: Diagnosis not present

## 2023-05-17 DIAGNOSIS — G43919 Migraine, unspecified, intractable, without status migrainosus: Secondary | ICD-10-CM | POA: Diagnosis not present

## 2023-05-17 DIAGNOSIS — E559 Vitamin D deficiency, unspecified: Secondary | ICD-10-CM | POA: Diagnosis not present

## 2023-05-17 DIAGNOSIS — M542 Cervicalgia: Secondary | ICD-10-CM | POA: Diagnosis not present

## 2023-05-17 DIAGNOSIS — F17210 Nicotine dependence, cigarettes, uncomplicated: Secondary | ICD-10-CM | POA: Diagnosis not present

## 2023-05-17 DIAGNOSIS — R911 Solitary pulmonary nodule: Secondary | ICD-10-CM | POA: Diagnosis not present

## 2023-05-17 DIAGNOSIS — R300 Dysuria: Secondary | ICD-10-CM | POA: Diagnosis not present

## 2023-05-17 DIAGNOSIS — F419 Anxiety disorder, unspecified: Secondary | ICD-10-CM | POA: Diagnosis not present

## 2023-05-17 DIAGNOSIS — Z853 Personal history of malignant neoplasm of breast: Secondary | ICD-10-CM | POA: Diagnosis not present

## 2023-05-17 DIAGNOSIS — Z720 Tobacco use: Secondary | ICD-10-CM | POA: Diagnosis not present

## 2023-05-17 DIAGNOSIS — M545 Low back pain, unspecified: Secondary | ICD-10-CM | POA: Diagnosis not present

## 2023-05-26 DIAGNOSIS — H40013 Open angle with borderline findings, low risk, bilateral: Secondary | ICD-10-CM | POA: Diagnosis not present

## 2023-07-19 DIAGNOSIS — Z7289 Other problems related to lifestyle: Secondary | ICD-10-CM | POA: Diagnosis not present

## 2023-07-19 DIAGNOSIS — Z7251 High risk heterosexual behavior: Secondary | ICD-10-CM | POA: Diagnosis not present

## 2023-07-19 DIAGNOSIS — R319 Hematuria, unspecified: Secondary | ICD-10-CM | POA: Diagnosis not present

## 2023-07-19 DIAGNOSIS — Z13820 Encounter for screening for osteoporosis: Secondary | ICD-10-CM | POA: Diagnosis not present

## 2023-07-19 DIAGNOSIS — Z780 Asymptomatic menopausal state: Secondary | ICD-10-CM | POA: Diagnosis not present

## 2023-07-19 DIAGNOSIS — Z124 Encounter for screening for malignant neoplasm of cervix: Secondary | ICD-10-CM | POA: Diagnosis not present

## 2023-07-19 DIAGNOSIS — Z01419 Encounter for gynecological examination (general) (routine) without abnormal findings: Secondary | ICD-10-CM | POA: Diagnosis not present

## 2023-07-19 DIAGNOSIS — Z720 Tobacco use: Secondary | ICD-10-CM | POA: Diagnosis not present

## 2023-07-19 DIAGNOSIS — N390 Urinary tract infection, site not specified: Secondary | ICD-10-CM | POA: Diagnosis not present

## 2023-07-19 DIAGNOSIS — R636 Underweight: Secondary | ICD-10-CM | POA: Diagnosis not present

## 2023-07-19 DIAGNOSIS — Z1159 Encounter for screening for other viral diseases: Secondary | ICD-10-CM | POA: Diagnosis not present

## 2023-07-19 DIAGNOSIS — Z206 Contact with and (suspected) exposure to human immunodeficiency virus [HIV]: Secondary | ICD-10-CM | POA: Diagnosis not present

## 2023-07-25 DIAGNOSIS — M545 Low back pain, unspecified: Secondary | ICD-10-CM | POA: Diagnosis not present

## 2023-07-25 DIAGNOSIS — E785 Hyperlipidemia, unspecified: Secondary | ICD-10-CM | POA: Diagnosis not present

## 2023-07-25 DIAGNOSIS — E559 Vitamin D deficiency, unspecified: Secondary | ICD-10-CM | POA: Diagnosis not present

## 2023-07-25 DIAGNOSIS — F339 Major depressive disorder, recurrent, unspecified: Secondary | ICD-10-CM | POA: Diagnosis not present

## 2023-07-25 DIAGNOSIS — J441 Chronic obstructive pulmonary disease with (acute) exacerbation: Secondary | ICD-10-CM | POA: Diagnosis not present

## 2023-07-25 DIAGNOSIS — Z720 Tobacco use: Secondary | ICD-10-CM | POA: Diagnosis not present

## 2023-07-25 DIAGNOSIS — G43919 Migraine, unspecified, intractable, without status migrainosus: Secondary | ICD-10-CM | POA: Diagnosis not present

## 2023-07-25 DIAGNOSIS — Z853 Personal history of malignant neoplasm of breast: Secondary | ICD-10-CM | POA: Diagnosis not present

## 2023-07-25 DIAGNOSIS — R319 Hematuria, unspecified: Secondary | ICD-10-CM | POA: Diagnosis not present

## 2023-07-25 DIAGNOSIS — Z681 Body mass index (BMI) 19 or less, adult: Secondary | ICD-10-CM | POA: Diagnosis not present

## 2023-07-25 DIAGNOSIS — R911 Solitary pulmonary nodule: Secondary | ICD-10-CM | POA: Diagnosis not present

## 2023-07-25 DIAGNOSIS — M542 Cervicalgia: Secondary | ICD-10-CM | POA: Diagnosis not present

## 2023-07-25 DIAGNOSIS — F419 Anxiety disorder, unspecified: Secondary | ICD-10-CM | POA: Diagnosis not present

## 2023-07-29 DIAGNOSIS — R319 Hematuria, unspecified: Secondary | ICD-10-CM | POA: Diagnosis not present

## 2023-07-29 DIAGNOSIS — Z853 Personal history of malignant neoplasm of breast: Secondary | ICD-10-CM | POA: Diagnosis not present

## 2023-08-05 DIAGNOSIS — L0291 Cutaneous abscess, unspecified: Secondary | ICD-10-CM | POA: Diagnosis not present

## 2023-08-05 DIAGNOSIS — Z720 Tobacco use: Secondary | ICD-10-CM | POA: Diagnosis not present

## 2023-08-11 DIAGNOSIS — Z122 Encounter for screening for malignant neoplasm of respiratory organs: Secondary | ICD-10-CM | POA: Diagnosis not present

## 2023-08-11 DIAGNOSIS — Z87891 Personal history of nicotine dependence: Secondary | ICD-10-CM | POA: Diagnosis not present

## 2023-08-11 DIAGNOSIS — F17210 Nicotine dependence, cigarettes, uncomplicated: Secondary | ICD-10-CM | POA: Diagnosis not present

## 2023-08-31 DIAGNOSIS — Z780 Asymptomatic menopausal state: Secondary | ICD-10-CM | POA: Diagnosis not present

## 2023-08-31 DIAGNOSIS — Z13820 Encounter for screening for osteoporosis: Secondary | ICD-10-CM | POA: Diagnosis not present

## 2023-08-31 DIAGNOSIS — R636 Underweight: Secondary | ICD-10-CM | POA: Diagnosis not present

## 2023-08-31 DIAGNOSIS — Z720 Tobacco use: Secondary | ICD-10-CM | POA: Diagnosis not present

## 2023-09-30 DIAGNOSIS — M545 Low back pain, unspecified: Secondary | ICD-10-CM | POA: Diagnosis not present

## 2023-09-30 DIAGNOSIS — M5431 Sciatica, right side: Secondary | ICD-10-CM | POA: Diagnosis not present

## 2023-09-30 DIAGNOSIS — R300 Dysuria: Secondary | ICD-10-CM | POA: Diagnosis not present

## 2023-10-17 DIAGNOSIS — N644 Mastodynia: Secondary | ICD-10-CM | POA: Diagnosis not present

## 2023-10-17 DIAGNOSIS — N6489 Other specified disorders of breast: Secondary | ICD-10-CM | POA: Diagnosis not present

## 2023-10-17 DIAGNOSIS — N6320 Unspecified lump in the left breast, unspecified quadrant: Secondary | ICD-10-CM | POA: Diagnosis not present

## 2023-10-17 DIAGNOSIS — Z1231 Encounter for screening mammogram for malignant neoplasm of breast: Secondary | ICD-10-CM | POA: Diagnosis not present

## 2023-10-17 DIAGNOSIS — C50311 Malignant neoplasm of lower-inner quadrant of right female breast: Secondary | ICD-10-CM | POA: Diagnosis not present

## 2023-10-17 DIAGNOSIS — Z170 Estrogen receptor positive status [ER+]: Secondary | ICD-10-CM | POA: Diagnosis not present

## 2023-11-15 DIAGNOSIS — J441 Chronic obstructive pulmonary disease with (acute) exacerbation: Secondary | ICD-10-CM | POA: Diagnosis not present

## 2023-11-15 DIAGNOSIS — Z681 Body mass index (BMI) 19 or less, adult: Secondary | ICD-10-CM | POA: Diagnosis not present

## 2023-11-29 DIAGNOSIS — F419 Anxiety disorder, unspecified: Secondary | ICD-10-CM | POA: Diagnosis not present

## 2023-11-29 DIAGNOSIS — J019 Acute sinusitis, unspecified: Secondary | ICD-10-CM | POA: Diagnosis not present

## 2023-11-29 DIAGNOSIS — R5383 Other fatigue: Secondary | ICD-10-CM | POA: Diagnosis not present

## 2023-11-29 DIAGNOSIS — Z720 Tobacco use: Secondary | ICD-10-CM | POA: Diagnosis not present

## 2023-11-29 DIAGNOSIS — J441 Chronic obstructive pulmonary disease with (acute) exacerbation: Secondary | ICD-10-CM | POA: Diagnosis not present

## 2023-11-29 DIAGNOSIS — Z20828 Contact with and (suspected) exposure to other viral communicable diseases: Secondary | ICD-10-CM | POA: Diagnosis not present

## 2023-11-29 DIAGNOSIS — Z681 Body mass index (BMI) 19 or less, adult: Secondary | ICD-10-CM | POA: Diagnosis not present

## 2023-11-29 DIAGNOSIS — M545 Low back pain, unspecified: Secondary | ICD-10-CM | POA: Diagnosis not present

## 2023-11-29 DIAGNOSIS — R051 Acute cough: Secondary | ICD-10-CM | POA: Diagnosis not present

## 2023-11-29 DIAGNOSIS — R300 Dysuria: Secondary | ICD-10-CM | POA: Diagnosis not present

## 2023-12-24 DIAGNOSIS — Z20828 Contact with and (suspected) exposure to other viral communicable diseases: Secondary | ICD-10-CM | POA: Diagnosis not present

## 2023-12-24 DIAGNOSIS — R5383 Other fatigue: Secondary | ICD-10-CM | POA: Diagnosis not present

## 2023-12-24 DIAGNOSIS — R300 Dysuria: Secondary | ICD-10-CM | POA: Diagnosis not present

## 2023-12-24 DIAGNOSIS — J029 Acute pharyngitis, unspecified: Secondary | ICD-10-CM | POA: Diagnosis not present

## 2023-12-24 DIAGNOSIS — Z681 Body mass index (BMI) 19 or less, adult: Secondary | ICD-10-CM | POA: Diagnosis not present

## 2023-12-24 DIAGNOSIS — J020 Streptococcal pharyngitis: Secondary | ICD-10-CM | POA: Diagnosis not present

## 2023-12-24 DIAGNOSIS — R0981 Nasal congestion: Secondary | ICD-10-CM | POA: Diagnosis not present

## 2023-12-24 DIAGNOSIS — R509 Fever, unspecified: Secondary | ICD-10-CM | POA: Diagnosis not present

## 2024-01-03 DIAGNOSIS — J029 Acute pharyngitis, unspecified: Secondary | ICD-10-CM | POA: Diagnosis not present

## 2024-01-03 DIAGNOSIS — Z720 Tobacco use: Secondary | ICD-10-CM | POA: Diagnosis not present

## 2024-01-03 DIAGNOSIS — Z681 Body mass index (BMI) 19 or less, adult: Secondary | ICD-10-CM | POA: Diagnosis not present

## 2024-01-03 DIAGNOSIS — Z112 Encounter for screening for other bacterial diseases: Secondary | ICD-10-CM | POA: Diagnosis not present

## 2024-01-03 DIAGNOSIS — R0789 Other chest pain: Secondary | ICD-10-CM | POA: Diagnosis not present

## 2024-01-03 DIAGNOSIS — M542 Cervicalgia: Secondary | ICD-10-CM | POA: Diagnosis not present

## 2024-01-03 DIAGNOSIS — R300 Dysuria: Secondary | ICD-10-CM | POA: Diagnosis not present

## 2024-02-10 DIAGNOSIS — Z720 Tobacco use: Secondary | ICD-10-CM | POA: Diagnosis not present

## 2024-02-10 DIAGNOSIS — R911 Solitary pulmonary nodule: Secondary | ICD-10-CM | POA: Diagnosis not present

## 2024-02-10 DIAGNOSIS — Z122 Encounter for screening for malignant neoplasm of respiratory organs: Secondary | ICD-10-CM | POA: Diagnosis not present

## 2024-02-10 DIAGNOSIS — R918 Other nonspecific abnormal finding of lung field: Secondary | ICD-10-CM | POA: Diagnosis not present

## 2024-02-10 DIAGNOSIS — J432 Centrilobular emphysema: Secondary | ICD-10-CM | POA: Diagnosis not present

## 2024-02-21 DIAGNOSIS — L570 Actinic keratosis: Secondary | ICD-10-CM | POA: Diagnosis not present

## 2024-02-21 DIAGNOSIS — L821 Other seborrheic keratosis: Secondary | ICD-10-CM | POA: Diagnosis not present

## 2024-02-21 DIAGNOSIS — D1801 Hemangioma of skin and subcutaneous tissue: Secondary | ICD-10-CM | POA: Diagnosis not present

## 2024-02-21 DIAGNOSIS — L814 Other melanin hyperpigmentation: Secondary | ICD-10-CM | POA: Diagnosis not present

## 2024-02-27 DIAGNOSIS — J441 Chronic obstructive pulmonary disease with (acute) exacerbation: Secondary | ICD-10-CM | POA: Diagnosis not present

## 2024-02-27 DIAGNOSIS — J029 Acute pharyngitis, unspecified: Secondary | ICD-10-CM | POA: Diagnosis not present

## 2024-02-27 DIAGNOSIS — Z681 Body mass index (BMI) 19 or less, adult: Secondary | ICD-10-CM | POA: Diagnosis not present

## 2024-02-27 DIAGNOSIS — Z720 Tobacco use: Secondary | ICD-10-CM | POA: Diagnosis not present

## 2024-02-27 DIAGNOSIS — Z112 Encounter for screening for other bacterial diseases: Secondary | ICD-10-CM | POA: Diagnosis not present

## 2024-02-27 DIAGNOSIS — F419 Anxiety disorder, unspecified: Secondary | ICD-10-CM | POA: Diagnosis not present

## 2024-02-27 DIAGNOSIS — M545 Low back pain, unspecified: Secondary | ICD-10-CM | POA: Diagnosis not present

## 2024-02-27 DIAGNOSIS — R300 Dysuria: Secondary | ICD-10-CM | POA: Diagnosis not present

## 2024-04-25 DIAGNOSIS — Z170 Estrogen receptor positive status [ER+]: Secondary | ICD-10-CM | POA: Diagnosis not present

## 2024-04-25 DIAGNOSIS — C50311 Malignant neoplasm of lower-inner quadrant of right female breast: Secondary | ICD-10-CM | POA: Diagnosis not present

## 2024-05-04 DIAGNOSIS — R300 Dysuria: Secondary | ICD-10-CM | POA: Diagnosis not present

## 2024-05-04 DIAGNOSIS — S1096XA Insect bite of unspecified part of neck, initial encounter: Secondary | ICD-10-CM | POA: Diagnosis not present

## 2024-05-04 DIAGNOSIS — Z681 Body mass index (BMI) 19 or less, adult: Secondary | ICD-10-CM | POA: Diagnosis not present

## 2024-07-20 DIAGNOSIS — Z2089 Contact with and (suspected) exposure to other communicable diseases: Secondary | ICD-10-CM | POA: Diagnosis not present

## 2024-07-20 DIAGNOSIS — J441 Chronic obstructive pulmonary disease with (acute) exacerbation: Secondary | ICD-10-CM | POA: Diagnosis not present

## 2024-07-20 DIAGNOSIS — J029 Acute pharyngitis, unspecified: Secondary | ICD-10-CM | POA: Diagnosis not present

## 2024-07-20 DIAGNOSIS — Z112 Encounter for screening for other bacterial diseases: Secondary | ICD-10-CM | POA: Diagnosis not present

## 2024-07-20 DIAGNOSIS — Z20828 Contact with and (suspected) exposure to other viral communicable diseases: Secondary | ICD-10-CM | POA: Diagnosis not present

## 2024-08-20 DIAGNOSIS — R300 Dysuria: Secondary | ICD-10-CM | POA: Diagnosis not present

## 2024-08-21 DIAGNOSIS — N950 Postmenopausal bleeding: Secondary | ICD-10-CM | POA: Diagnosis not present

## 2024-08-21 DIAGNOSIS — R300 Dysuria: Secondary | ICD-10-CM | POA: Diagnosis not present

## 2024-08-21 DIAGNOSIS — Z853 Personal history of malignant neoplasm of breast: Secondary | ICD-10-CM | POA: Diagnosis not present

## 2024-08-31 ENCOUNTER — Ambulatory Visit (INDEPENDENT_AMBULATORY_CARE_PROVIDER_SITE_OTHER): Admitting: Adult Health

## 2024-08-31 ENCOUNTER — Encounter: Payer: Self-pay | Admitting: Adult Health

## 2024-08-31 VITALS — BP 119/88 | HR 96 | Ht <= 58 in | Wt 80.0 lb

## 2024-08-31 DIAGNOSIS — Z853 Personal history of malignant neoplasm of breast: Secondary | ICD-10-CM

## 2024-08-31 DIAGNOSIS — N950 Postmenopausal bleeding: Secondary | ICD-10-CM

## 2024-08-31 DIAGNOSIS — Z1331 Encounter for screening for depression: Secondary | ICD-10-CM | POA: Diagnosis not present

## 2024-08-31 NOTE — Progress Notes (Signed)
  Subjective:     Patient ID: Amber Salazar, female   DOB: 1961/02/07, 63 y.o.   MRN: [id number]  HPI Amber Salazar is a 63 year old white female,single, PM in for complaint of PMB happened 2 days after getting out of shower, first was 08/19/24 felt gush then spotted and it stopped, then 2 days later happened again. She had US  at [facility] 08/21/24 and endometrial lining was thickened at 10 mm, ovaries looked normal. She has a history of breast cancer. HGB was 13.3.08/20/24.  Last pap was 07/19/23 and negative   PCP is Izetta Raymond PA  Review of Systems PMB for 2 days,08/19/24 and 08/21/24, would have gush, then spot then stop    Not sexually active Reviewed past medical,surgical, social and family history. Reviewed medications and allergies.  Objective:   Physical Exam BP 119/88 (BP Location: Right Arm, Patient Position: Sitting, Cuff Size: Normal)   Pulse 96   Ht 4' 9 (1.448 m)   Wt 80 lb (36.3 kg)   BMI 17.31 kg/m     Skin warm and dry.  Lungs: clear to ausculation bilaterally. Cardiovascular: regular rate and rhythm.  Reviewed US  with her from [facility].  AA is 0 Fall risk is low    08/31/2024   11:00 AM  Depression screen PHQ 2/9  Decreased Interest 0  Down, Depressed, Hopeless 3  PHQ - 2 Score 3  Altered sleeping 0  Tired, decreased energy 0  Change in appetite 0  Feeling bad or failure about yourself  0  Trouble concentrating 0  Moving slowly or fidgety/restless 0  Suicidal thoughts 0  PHQ-9 Score 3       08/31/2024   11:00 AM  GAD 7 : Generalized Anxiety Score  Nervous, Anxious, on Edge 3  Control/stop worrying 3  Worry too much - different things 3  Trouble relaxing 3  Restless 0  Easily annoyed or irritable 0  Afraid - awful might happen 0  Total GAD 7 Score 12      Assessment:     1. PMB (postmenopausal bleeding) (Primary) +PMB for 2 days,08/19/24 and 08/21/24, would have gush, then spot then stop  Endometrial lining is thickened at 10 mm Discussed  endometrial biopsy, to rule out uterine cancer   2. History of breast cancer Had 2019     Plan:     Return 09/03/24 for endometrial biopsy with Dr Ozan

## 2024-09-03 ENCOUNTER — Ambulatory Visit (INDEPENDENT_AMBULATORY_CARE_PROVIDER_SITE_OTHER): Admitting: Obstetrics & Gynecology

## 2024-09-03 ENCOUNTER — Other Ambulatory Visit (HOSPITAL_COMMUNITY)
Admission: RE | Admit: 2024-09-03 | Discharge: 2024-09-03 | Disposition: A | Discharge status: Home / Self Care | Source: Ambulatory Visit | Attending: Obstetrics & Gynecology | Admitting: Obstetrics & Gynecology

## 2024-09-03 ENCOUNTER — Encounter: Payer: Self-pay | Admitting: Obstetrics & Gynecology

## 2024-09-03 VITALS — BP 149/87 | HR 106 | Ht <= 58 in | Wt 81.0 lb

## 2024-09-03 DIAGNOSIS — N858 Other specified noninflammatory disorders of uterus: Secondary | ICD-10-CM | POA: Diagnosis not present

## 2024-09-03 DIAGNOSIS — N950 Postmenopausal bleeding: Secondary | ICD-10-CM | POA: Diagnosis not present

## 2024-09-03 DIAGNOSIS — N952 Postmenopausal atrophic vaginitis: Secondary | ICD-10-CM

## 2024-09-03 DIAGNOSIS — R9389 Abnormal findings on diagnostic imaging of other specified body structures: Secondary | ICD-10-CM

## 2024-09-03 DIAGNOSIS — Z853 Personal history of malignant neoplasm of breast: Secondary | ICD-10-CM

## 2024-09-03 NOTE — Progress Notes (Signed)
 GYN VISIT Patient name: Janat M Peabody MRN [id number]  Date of birth: 04-Dec-1960 Chief Complaint:   PMB  History of Present Illness:   Aalaiyah M Spoto is a 63 y.o. [id number] PM female with h/o breast Ca being seen today for follow up regarding:  -seen by DOROTHA Lewis on 10/24 pt had noted 2 day of PMB early October.  Noted blood trickling down her leg, large gush then bleeding slowed down just a little on her bad.  Bleeding only occurred on Sunday and Tuesday- after the US .  No further bleeding since then.  Patient is not sexually active.  She notes dryness, but states it is not bothersome to her.  Notes history of frequent UTIs, but in review of her chart I do not see anything within care everywhere  US  at outside facility: endometrial stripe 10mm.  Normal ovaries  She has also noted leg pain and back pain.  Not sure about aggravating factors.  Patient notes history of chronic nausea.  Denies vomiting.  She notes bouts of diarrhea and constipation, but no significant change recently.  No LMP recorded. Patient is postmenopausal.    Review of Systems:   Pertinent items are noted in HPI Denies fever/chills, dizziness, headaches, visual disturbances, fatigue, shortness of breath, chest pain, abdominal pain, vomiting. Pertinent History Reviewed:   Past Surgical History:  Procedure Laterality Date   BREAST SURGERY Right    CESAREAN SECTION     x's 4   CRYOTHERAPY     TONSILLECTOMY     TUBAL LIGATION  12/23/1998   [facility]    Past Medical History:  Diagnosis Date   Anxiety disorder    Cancer [facility])    right breast cancer   Chronic back pain    COPD (chronic obstructive pulmonary disease) (HCC)    History of bronchiectasis    Migraine headache    Pulmonary nodules    followed by Dr. Charlott   Spinal headache    after giving birth to last child-2000   Tobacco abuse    Vaginal Pap smear, abnormal    Reviewed problem list, medications and allergies. Physical Assessment:    Vitals:   09/03/24 0848 09/03/24 0850  BP: (!) 161/91 (!) 149/87  Pulse: (!) 106   Weight: 81 lb (36.7 kg)   Height: 4' 9 (1.448 m)   Body mass index is 17.53 kg/m.       Physical Examination:   General appearance: alert, well appearing, and in no distress  Psych: mood appropriate, normal affect  Skin: warm & dry   Cardiovascular: normal heart rate noted  Respiratory: normal respiratory effort, no distress  Abdomen: soft, non-tender, no rebound or guarding  Pelvic: Atrophic changes noted, small speculum used  VULVA: normal appearing vulva with no masses, tenderness or lesions, VAGINA: Pink flat mucosa, no discharge noted, no lesions, CERVIX: normal appearing cervix without discharge or lesions, UTERUS: uterus is normal size, shape, consistency and nontender, ADNEXA: normal adnexa in size, nontender and no masses  Extremities: no edema   Chaperone: Aleck Blase    Endometrial Biopsy Procedure Note  Pre-operative Diagnosis: PMB, thickened endometrium  Post-operative Diagnosis: same  Procedure Details  The risks (including infection, bleeding, pain, and uterine perforation) and benefits of the procedure were explained to the patient and Written informed consent was obtained.  Antibiotic prophylaxis against endocarditis was not indicated.   The patient was placed in the dorsal lithotomy position.  Bimanual exam showed the uterus to be in the neutral  position.  A speculum inserted in the vagina, and the cervix prepped with betadine.     A single tooth tenaculum was applied to the anterior lip of the cervix for stabilization.  Os finder was used.  A Pipelle endometrial aspirator was used to sample the endometrium.  Sample was sent for pathologic examination.  Condition: Stable  Complications: None    Assessment & Plan:  1) PMB, Thickened endometrium, vaginal atrophy - Discussed that in the setting of thickened endometrium and episode of PMB, would recommend further  evaluation via endometrial biopsy - Discussed bleeding etiology ranging from atrophy to endometrial cancer - EMB completed as above - Further management pending pathology report.  Discussed with patient concern for an adequate sampling; however based on exam and history do not suspect underlying endometrial cancer. - Should she note further bleeding, would consider further evaluation such as hysteroscopy D&C - Briefly discussed vaginal estrogen therapy as component of treatment for dryness and recurrent UTIs.  Discussed that we would review with oncologist due to history of breast CA.  Patient notes that she would prefer to avoid at this time and will continue to monitor -The patient was advised to call for any fever or for prolonged or severe pain or bleeding. She was advised to use OTC analgesics as needed for mild to moderate pain. She was advised to avoid vaginal intercourse for 48 hours or until the bleeding has completely stopped.  No orders of the defined types were placed in this encounter.   Return in about 1 year (around 09/03/2025) for Annual.   Pau Banh, DO Attending Obstetrician & Gynecologist, [facility] for [facility], [facility]

## 2024-09-07 ENCOUNTER — Telehealth: Payer: Self-pay | Admitting: Obstetrics & Gynecology

## 2024-09-07 ENCOUNTER — Ambulatory Visit: Payer: Self-pay | Admitting: Obstetrics & Gynecology

## 2024-09-07 LAB — SURGICAL PATHOLOGY

## 2024-09-07 NOTE — Telephone Encounter (Signed)
 Pt would like a call about her test results. Please advise
